# Patient Record
Sex: Female | Born: 1940 | Race: White | Hispanic: No | Smoking: Former smoker
Health system: Southern US, Community
[De-identification: ages and names within clinical notes are randomized; demographics above are authoritative.]

## PROBLEM LIST (undated history)

## (undated) DIAGNOSIS — K219 Gastro-esophageal reflux disease without esophagitis: Secondary | ICD-10-CM

## (undated) DIAGNOSIS — D649 Anemia, unspecified: Secondary | ICD-10-CM

## (undated) DIAGNOSIS — E785 Hyperlipidemia, unspecified: Secondary | ICD-10-CM

## (undated) DIAGNOSIS — I350 Nonrheumatic aortic (valve) stenosis: Secondary | ICD-10-CM

## (undated) DIAGNOSIS — C50919 Malignant neoplasm of unspecified site of unspecified female breast: Secondary | ICD-10-CM

## (undated) DIAGNOSIS — F329 Major depressive disorder, single episode, unspecified: Secondary | ICD-10-CM

## (undated) DIAGNOSIS — I10 Essential (primary) hypertension: Secondary | ICD-10-CM

## (undated) DIAGNOSIS — K5790 Diverticulosis of intestine, part unspecified, without perforation or abscess without bleeding: Secondary | ICD-10-CM

## (undated) DIAGNOSIS — M1990 Unspecified osteoarthritis, unspecified site: Secondary | ICD-10-CM

## (undated) DIAGNOSIS — Z8673 Personal history of transient ischemic attack (TIA), and cerebral infarction without residual deficits: Secondary | ICD-10-CM

## (undated) DIAGNOSIS — F32A Depression, unspecified: Secondary | ICD-10-CM

## (undated) HISTORY — DX: Anemia, unspecified: D64.9

## (undated) HISTORY — DX: Hyperlipidemia, unspecified: E78.5

## (undated) HISTORY — DX: Gastro-esophageal reflux disease without esophagitis: K21.9

## (undated) HISTORY — DX: Personal history of transient ischemic attack (TIA), and cerebral infarction without residual deficits: Z86.73

## (undated) HISTORY — DX: Nonrheumatic aortic (valve) stenosis: I35.0

## (undated) HISTORY — DX: Diverticulosis of intestine, part unspecified, without perforation or abscess without bleeding: K57.90

## (undated) HISTORY — DX: Essential (primary) hypertension: I10

## (undated) HISTORY — PX: CYST EXCISION: SHX5701

## (undated) HISTORY — PX: BREAST BIOPSY: SHX20

## (undated) HISTORY — PX: OTHER SURGICAL HISTORY: SHX169

## (undated) HISTORY — DX: Depression, unspecified: F32.A

## (undated) HISTORY — DX: Unspecified osteoarthritis, unspecified site: M19.90

## (undated) HISTORY — PX: CATARACT EXTRACTION: SUR2

---
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
## 2016-03-21 DIAGNOSIS — C50919 Malignant neoplasm of unspecified site of unspecified female breast: Secondary | ICD-10-CM

## 2016-03-21 HISTORY — DX: Malignant neoplasm of unspecified site of unspecified female breast: C50.919

## 2016-12-16 HISTORY — PX: BREAST BIOPSY: SHX20

## 2016-12-30 HISTORY — PX: BREAST LUMPECTOMY: SHX2

## 2017-10-09 ENCOUNTER — Encounter: Payer: Self-pay | Admitting: Cardiology

## 2018-12-07 ENCOUNTER — Other Ambulatory Visit: Payer: Self-pay

## 2018-12-07 ENCOUNTER — Ambulatory Visit (INDEPENDENT_AMBULATORY_CARE_PROVIDER_SITE_OTHER): Payer: Medicare Other | Admitting: Family Medicine

## 2018-12-07 ENCOUNTER — Encounter: Payer: Self-pay | Admitting: Family Medicine

## 2018-12-07 DIAGNOSIS — E785 Hyperlipidemia, unspecified: Secondary | ICD-10-CM | POA: Diagnosis not present

## 2018-12-07 DIAGNOSIS — G459 Transient cerebral ischemic attack, unspecified: Secondary | ICD-10-CM | POA: Diagnosis not present

## 2018-12-07 DIAGNOSIS — I10 Essential (primary) hypertension: Secondary | ICD-10-CM | POA: Diagnosis not present

## 2018-12-07 DIAGNOSIS — C50912 Malignant neoplasm of unspecified site of left female breast: Secondary | ICD-10-CM

## 2018-12-07 DIAGNOSIS — Z1239 Encounter for other screening for malignant neoplasm of breast: Secondary | ICD-10-CM

## 2018-12-07 DIAGNOSIS — Z170 Estrogen receptor positive status [ER+]: Secondary | ICD-10-CM

## 2018-12-07 DIAGNOSIS — F329 Major depressive disorder, single episode, unspecified: Secondary | ICD-10-CM

## 2018-12-07 DIAGNOSIS — Z7689 Persons encountering health services in other specified circumstances: Secondary | ICD-10-CM

## 2018-12-07 DIAGNOSIS — F17200 Nicotine dependence, unspecified, uncomplicated: Secondary | ICD-10-CM

## 2018-12-07 DIAGNOSIS — E538 Deficiency of other specified B group vitamins: Secondary | ICD-10-CM

## 2018-12-07 DIAGNOSIS — Z1231 Encounter for screening mammogram for malignant neoplasm of breast: Secondary | ICD-10-CM

## 2018-12-07 DIAGNOSIS — Z9889 Other specified postprocedural states: Secondary | ICD-10-CM

## 2018-12-07 DIAGNOSIS — Z5181 Encounter for therapeutic drug level monitoring: Secondary | ICD-10-CM

## 2018-12-07 DIAGNOSIS — K219 Gastro-esophageal reflux disease without esophagitis: Secondary | ICD-10-CM | POA: Diagnosis not present

## 2018-12-07 DIAGNOSIS — I350 Nonrheumatic aortic (valve) stenosis: Secondary | ICD-10-CM

## 2018-12-07 MED ORDER — LETROZOLE 2.5 MG PO TABS: 2.5000 mg | ORAL_TABLET | Freq: Every day | ORAL | 1 refills | Status: DC

## 2018-12-07 MED ORDER — VENLAFAXINE HCL ER 37.5 MG PO CP24: ORAL_CAPSULE | ORAL | 1 refills | Status: DC

## 2018-12-07 NOTE — Progress Notes (Signed)
Name: Heather Carpenter   MRN: [id number]    DOB: 09/15/1940   Date:12/07/2018       Progress Note  Chief Complaint  Patient presents with  . Establish Care    also needs mammo ordered hx breast cancer  . Medication Refill    venafalaxine refill 90 day     Subjective:   Heather Carpenter is a 78 y.o. female, presents to clinic for routine follow up on the conditions listed above.  Moved her from [place] after recent divorce, has a daughter who lives here and now lives in [place].  She needs to establish with several specialist in the area including cardiology and oncology, she will be requesting records for these will have to come in to sign for that.  HTN - patient reports a history of hypertension that is well controlled on valsartan and hydrochlorothiazide she does not have any side effects or concerns of her medication, her blood pressure has always been controlled.  She denies any hypotension, denies any chest pain, chest pressure, orthopnea, palpitations, lower extremity edema, near syncope, headaches or visual disturbances.  HLD-  Patient is on Lipitor 40 mg daily she takes as prescribed, she does not have any myalgias or concerning side effects.  She has been on it for several years.  She will come in for fasting labs  TIA-patient states that she has been on Plavix since she had TIA symptoms but she had no stroke or deficit  Breast CA -  Patient has a history of breast cancer -left breast she is on tamoxifen she will request oncology records and she would like to establish locally with an oncologist.  Depression for past 30 years - on effexor for about the same amount of time.  She would like to decrease or get off this med.  Her ex-husband was the source of a lot of her depression she says and she is very happy here.   She has run out of meds before, she felt dizzy, jittery, with some GI sx as well, but she would like to gradually decrease the dose and see if she can feel  good off the medications.  GERD- She does take daily Prilosec, 20 mg.  She has tried to wean off of it before but she will have worsening acid reflux when she does that.  She denies any melena, hematochezia, abdominal pain bloating or indigestion.  She has had no unintentional weight loss, denies dysphasia She did see GI in the past and has had a esophageal dilatation, she is currently not having any trouble swallowing foods, does not know when she is due for follow-up.  Current every day smoker-she does not wish to stop smoking at this time  PCP in [place], [place], [place], [place]  Cardiology-  [place], [place]   Patient Active Problem List   Diagnosis Date Noted  . Gastroesophageal reflux disease 12/12/2018  . Malignant neoplasm of left breast in female, estrogen receptor positive ([place]) 12/12/2018  . Hyperlipidemia 12/12/2018  . Essential hypertension 12/12/2018  . Current smoker 12/12/2018  . Vitamin B12 deficiency 12/12/2018  . Major depressive disorder with current active episode 12/12/2018  . Aortic valve stenosis 12/12/2018  . History of esophageal dilatation 12/12/2018  . TIA on medication 12/12/2018    Past Surgical History:  Procedure Laterality Date  . BREAST LUMPECTOMY Left 2017   lymph nodes   . CATARACT  EXTRACTION    . collapsed lung       Family History  Problem Relation Age of Onset  . Stroke Mother   . Arthritis Mother   . Heart failure Father   . Prostate cancer Brother   . Depression Daughter   . Heart murmur Son   . Breast cancer Daughter     Social History   Socioeconomic History  . Marital status: Legally Separated    Spouse name: Not on file  . Number of children: 3  . Years of education: 42  . Highest education level: Not on file  Occupational History  . Occupation: retired  Scientific laboratory technician  . Financial resource strain: Not hard at all  . Food  insecurity    Worry: Never true    Inability: Never true  . Transportation needs    Medical: No    Non-medical: No  Tobacco Use  . Smoking status: Current Every Day Smoker    Packs/day: 0.50    Types: Cigarettes  . Smokeless tobacco: Never Used  Substance and Sexual Activity  . Alcohol use: Yes  . Drug use: Never  . Sexual activity: Not Currently  Lifestyle  . Physical activity    Days per week: 7 days    Minutes per session: 30 min  . Stress: Not at all  Relationships  . Social connections    Talks on phone: More than three times a week    Gets together: More than three times a week    Attends religious service: Never    Active member of club or organization: No    Attends meetings of clubs or organizations: Never    Relationship status: Separated  . Intimate partner violence    Fear of current or ex partner: No    Emotionally abused: No    Physically abused: No    Forced sexual activity: No  Other Topics Concern  . Not on file  Social History Narrative  . Not on file     Current Outpatient Medications:  .  atorvastatin (LIPITOR) 40 MG tablet, Take 40 mg by mouth daily., Disp: , Rfl:  .  cholecalciferol (VITAMIN D3) 25 MCG (1000 UT) tablet, Take 2,000 Units by mouth daily., Disp: , Rfl:  .  clopidogrel (PLAVIX) 75 MG tablet, Take 75 mg by mouth daily., Disp: , Rfl:  .  letrozole (FEMARA) 2.5 MG tablet, Take 2.5 mg by mouth daily., Disp: , Rfl:  .  omeprazole (PRILOSEC) 20 MG capsule, Take 20 mg by mouth daily., Disp: , Rfl:  .  valsartan-hydrochlorothiazide (DIOVAN-HCT) 80-12.5 MG tablet, Take 1 tablet by mouth daily., Disp: , Rfl:  .  Venlafaxine HCl 75 MG TB24, Take 75 mg by mouth daily., Disp: , Rfl:  .  vitamin B-12 (CYANOCOBALAMIN) 1000 MCG tablet, Take 1,000 mcg by mouth daily., Disp: , Rfl:   No Known Allergies  I personally reviewed active problem list, medication list, allergies, family history, social history, notes from last encounter, lab results with  the patient/caregiver today.  Review of Systems  Constitutional: Negative.   HENT: Negative.   Eyes: Negative.   Respiratory: Negative.   Cardiovascular: Negative.   Gastrointestinal: Negative.   Endocrine: Negative.   Genitourinary: Negative.   Musculoskeletal: Negative.   Skin: Negative.   Allergic/Immunologic: Negative.   Neurological: Negative.   Hematological: Negative.   Psychiatric/Behavioral: Negative.   All other systems reviewed and are negative.    Objective:    There were no vitals filed  for this visit.  There is no height or weight on file to calculate BMI.  Physical Exam Vitals signs and nursing note reviewed.  Constitutional:      Appearance: Normal appearance. She is well-developed.  HENT:     Head: Normocephalic and atraumatic.     Nose: Nose normal.  Eyes:     General:        Right eye: No discharge.        Left eye: No discharge.     Conjunctiva/sclera: Conjunctivae normal.  Neck:     Trachea: No tracheal deviation.  Cardiovascular:     Rate and Rhythm: Normal rate and regular rhythm.  Pulmonary:     Effort: Pulmonary effort is normal. No respiratory distress.     Breath sounds: No stridor.  Musculoskeletal: Normal range of motion.  Skin:    Findings: No rash.  Neurological:     Mental Status: She is alert.     Motor: No abnormal muscle tone.     Coordination: Coordination normal.  Psychiatric:        Behavior: Behavior normal.       PHQ2/9: Depression screen PHQ 2/9 12/07/2018  Decreased Interest 0  Down, Depressed, Hopeless 0  PHQ - 2 Score 0  Altered sleeping 0  Tired, decreased energy 1  Change in appetite 1  Feeling bad or failure about yourself  0  Trouble concentrating 0  Moving slowly or fidgety/restless 0  Suicidal thoughts 0  PHQ-9 Score 2  Difficult doing work/chores Not difficult at all    phq 9 is negative-reviewed today, change in medication planned  Fall Risk: Fall Risk  12/07/2018  Falls in the past year? 0   Number falls in past yr: 0  Injury with Fall? 0      Functional Status Survey: Is the patient deaf or have difficulty hearing?: No Does the patient have difficulty seeing, even when wearing glasses/contacts?: No Does the patient have difficulty concentrating, remembering, or making decisions?: No Does the patient have difficulty walking or climbing stairs?: No Does the patient have difficulty dressing or bathing?: No Does the patient have difficulty doing errands alone such as visiting a doctor's office or shopping?: No    Assessment & Plan:     ICD-10-CM   1. Essential hypertension  99991111 COMPLETE METABOLIC PANEL WITH GFR  2. Hyperlipidemia, unspecified hyperlipidemia type  99991111 COMPLETE METABOLIC PANEL WITH GFR    Lipid panel   lipitor 40 mg  HLD for more than 15 yrs  3. TIA on medication  G45.9    on plavix  4. Gastroesophageal reflux disease, esophagitis presence not specified  K21.9    on omeprazole - well controlled with daily meds, sx if she holds or weans   5. Malignant neoplasm of left breast in female, estrogen receptor positive, unspecified site of breast ([place])  C50.912 letrozole (FEMARA) 2.5 MG tablet   Z17.0    need local oncologist - on tamoxifen  6. History of esophageal dilatation  Z98.890 Ambulatory referral to Oncology   hx of, no current sx, requesting records, on PPI  7. Aortic valve stenosis, etiology of cardiac valve disease unspecified  I35.0 Ambulatory referral to Cardiology   reports - requests local referral  8. Major depressive disorder with current active episode, unspecified depression episode severity, unspecified whether recurrent  F32.9 venlafaxine XR (EFFEXOR-XR) 37.5 MG 24 hr capsule   is improving with change circumstances would like to wean off medications, discussed a gradual taper  9. Vitamin B12 deficiency  E53.8    Patient reports history of vitamin B deficiency on oral supplement would like to check her labs  10. Screening for  malignant neoplasm of breast  Z12.39 MM 3D SCREEN BREAST BILATERAL  11. Encounter for medication monitoring  Z51.81 CBC with Differential/Platelet    COMPLETE METABOLIC PANEL WITH GFR    Lipid panel  12. Encounter for screening mammogram for malignant neoplasm of breast  Z12.31 MM 3D SCREEN BREAST BILATERAL   Due for mammogram screening, history of left lumpectomy and breast cancer on tamoxifen  13. Encounter to establish care with new doctor  Z76.89    Have populated chart with his much history and information as possible, will request records, patient to come in for labs  14. Current smoker  F17.200    Patient does not wish to quit at this time     Return in about 6 months (around 06/06/2019) for Routine follow-up, Medicare Well Visit (next March) .   Delsa Grana, PA-C 12/07/18 10:58 AM

## 2018-12-12 ENCOUNTER — Encounter: Payer: Self-pay | Admitting: Family Medicine

## 2018-12-12 DIAGNOSIS — C50912 Malignant neoplasm of unspecified site of left female breast: Secondary | ICD-10-CM | POA: Insufficient documentation

## 2018-12-12 DIAGNOSIS — F330 Major depressive disorder, recurrent, mild: Secondary | ICD-10-CM | POA: Insufficient documentation

## 2018-12-12 DIAGNOSIS — F17200 Nicotine dependence, unspecified, uncomplicated: Secondary | ICD-10-CM | POA: Insufficient documentation

## 2018-12-12 DIAGNOSIS — E785 Hyperlipidemia, unspecified: Secondary | ICD-10-CM | POA: Insufficient documentation

## 2018-12-12 DIAGNOSIS — E538 Deficiency of other specified B group vitamins: Secondary | ICD-10-CM | POA: Insufficient documentation

## 2018-12-12 DIAGNOSIS — Z9889 Other specified postprocedural states: Secondary | ICD-10-CM | POA: Insufficient documentation

## 2018-12-12 DIAGNOSIS — I350 Nonrheumatic aortic (valve) stenosis: Secondary | ICD-10-CM | POA: Insufficient documentation

## 2018-12-12 DIAGNOSIS — G459 Transient cerebral ischemic attack, unspecified: Secondary | ICD-10-CM | POA: Insufficient documentation

## 2018-12-12 DIAGNOSIS — F329 Major depressive disorder, single episode, unspecified: Secondary | ICD-10-CM | POA: Insufficient documentation

## 2018-12-12 DIAGNOSIS — K219 Gastro-esophageal reflux disease without esophagitis: Secondary | ICD-10-CM | POA: Insufficient documentation

## 2018-12-12 DIAGNOSIS — I10 Essential (primary) hypertension: Secondary | ICD-10-CM | POA: Insufficient documentation

## 2018-12-15 LAB — COMPLETE METABOLIC PANEL WITH GFR
AG Ratio: 2 (calc) (ref 1.0–2.5)
ALT: 18 U/L (ref 6–29)
AST: 23 U/L (ref 10–35)
Albumin: 4.5 g/dL (ref 3.6–5.1)
Alkaline phosphatase (APISO): 76 U/L (ref 37–153)
BUN: 10 mg/dL (ref 7–25)
CO2: 23 mmol/L (ref 20–32)
Calcium: 10 mg/dL (ref 8.6–10.4)
Chloride: 103 mmol/L (ref 98–110)
Creat: 0.77 mg/dL (ref 0.60–0.93)
GFR, Est African American: 86 mL/min/{1.73_m2} (ref >=60)
GFR, Est Non African American: 74 mL/min/{1.73_m2} (ref >=60)
Globulin: 2.2 g/dL (calc) (ref 1.9–3.7)
Glucose, Bld: 92 mg/dL (ref 65–99)
Potassium: 4.9 mmol/L (ref 3.5–5.3)
Sodium: 139 mmol/L (ref 135–146)
Total Bilirubin: 0.4 mg/dL (ref 0.2–1.2)
Total Protein: 6.7 g/dL (ref 6.1–8.1)

## 2018-12-15 LAB — CBC WITH DIFFERENTIAL/PLATELET
Absolute Monocytes: 496 cells/uL (ref 200–950)
Basophils Absolute: 40 cells/uL (ref 0–200)
Basophils Relative: 0.7 %
Eosinophils Absolute: 97 cells/uL (ref 15–500)
Eosinophils Relative: 1.7 %
HCT: 41.4 % (ref 35.0–45.0)
Hemoglobin: 13.8 g/dL (ref 11.7–15.5)
Lymphs Abs: 2086 cells/uL (ref 850–3900)
MCH: 32.4 pg (ref 27.0–33.0)
MCHC: 33.3 g/dL (ref 32.0–36.0)
MCV: 97.2 fL (ref 80.0–100.0)
MPV: 8.7 fL (ref 7.5–12.5)
Monocytes Relative: 8.7 %
Neutro Abs: 2981 cells/uL (ref 1500–7800)
Neutrophils Relative %: 52.3 %
Platelets: 299 10*3/uL (ref 140–400)
RBC: 4.26 10*6/uL (ref 3.80–5.10)
RDW: 12.7 % (ref 11.0–15.0)
Total Lymphocyte: 36.6 %
WBC: 5.7 10*3/uL (ref 3.8–10.8)

## 2018-12-15 LAB — LIPID PANEL
Cholesterol: 126 mg/dL (ref <200)
HDL: 48 mg/dL — ABNORMAL LOW (ref >=50)
LDL Cholesterol (Calc): 55 mg/dL (calc)
Non-HDL Cholesterol (Calc): 78 mg/dL (calc) (ref <130)
Total CHOL/HDL Ratio: 2.6 (calc) (ref <5.0)
Triglycerides: 146 mg/dL (ref <150)

## 2018-12-17 ENCOUNTER — Encounter: Payer: Self-pay | Admitting: *Deleted

## 2018-12-18 ENCOUNTER — Encounter: Payer: Self-pay | Admitting: Cardiology

## 2018-12-18 ENCOUNTER — Ambulatory Visit (INDEPENDENT_AMBULATORY_CARE_PROVIDER_SITE_OTHER): Payer: Medicare Other | Admitting: Cardiology

## 2018-12-18 ENCOUNTER — Encounter: Payer: Self-pay | Admitting: Family Medicine

## 2018-12-18 ENCOUNTER — Other Ambulatory Visit: Payer: Self-pay

## 2018-12-18 VITALS — BP 122/80 | HR 82 | Temp 97.3°F | Ht 64.5 in | Wt 164.0 lb

## 2018-12-18 DIAGNOSIS — I10 Essential (primary) hypertension: Secondary | ICD-10-CM

## 2018-12-18 DIAGNOSIS — F17200 Nicotine dependence, unspecified, uncomplicated: Secondary | ICD-10-CM

## 2018-12-18 DIAGNOSIS — E7800 Pure hypercholesterolemia, unspecified: Secondary | ICD-10-CM | POA: Diagnosis not present

## 2018-12-18 DIAGNOSIS — I77810 Thoracic aortic ectasia: Secondary | ICD-10-CM | POA: Diagnosis not present

## 2018-12-18 DIAGNOSIS — I350 Nonrheumatic aortic (valve) stenosis: Secondary | ICD-10-CM | POA: Diagnosis not present

## 2018-12-18 NOTE — Patient Instructions (Signed)
Medication Instructions:  Your physician recommends that you continue on your current medications as directed. Please refer to the Current Medication list given to you today.  If you need a refill on your cardiac medications before your next appointment, please call your pharmacy.   Lab work: None ordered If you have labs (blood work) drawn today and your tests are completely normal, you will receive your results only by: Marland Kitchen MyChart Message (if you have MyChart) OR . A paper copy in the mail If you have any lab test that is abnormal or we need to change your treatment, we will call you to review the results.  Testing/Procedures: Your physician has requested that you have an echocardiogram. Echocardiography is a painless test that uses sound waves to create images of your heart. It provides your doctor with information about the size and shape of your heart and how well your heart's chambers and valves are working. This procedure takes approximately one hour. There are no restrictions for this procedure. ( To be scheduled in December 2020)  Follow-Up: At [facility], you and your health needs are our priority.  As part of our continuing mission to provide you with exceptional heart care, we have created designated Provider Care Teams.  These Care Teams include your primary Cardiologist (physician) and Advanced Practice Providers (APPs -  Physician Assistants and Nurse Practitioners) who all work together to provide you with the care you need, when you need it. You will need a follow up appointment 1 week after the echo.     You may see Kate Sable, MD or one of the following Advanced Practice Providers on your designated Care Team:   Murray Hodgkins, NP Christell Faith, PA-C . Marrianne Mood, PA-C  Any Other Special Instructions Will Be Listed Below (If Applicable). N/A

## 2018-12-18 NOTE — Progress Notes (Signed)
Cardiology Office Note:    Date:  12/18/2018   ID:  Heather Carpenter, DOB [birth year], MRN [id number]  PCP:  Delsa Grana, PA-C  Cardiologist:  Kate Sable, MD  Electrophysiologist:  None   Referring MD: Delsa Grana, PA-C   Chief Complaint  Patient presents with  . New Patient (Initial Visit)    Aortic stenosis    History of Present Illness:    Heather Carpenter is a 78 y.o. female with a hx of hypertension, hyperlipidemia, TIA, current smoker x60 years, moderate aortic valve stenosis who presents to establish care.  Patient recently moved from [place] to the area.  She has a known history of aortic valve stenosis.  Last echo performed on 09/11/2018.  Report showed moderate aortic valve stenosis with mean gradient 21 mmHg, EF 70 to 75%.  States doing okay, denies chest pain, palpitations, shortness of breath, orthopnea, presyncope, syncope.  She is recently divorced from her husband for about 19 years.  She states being depressed from the relationship.  He takes Plavix due to history of TIA.  Past Medical History:  Diagnosis Date  . Anemia   . Breast cancer ([place])   . Depression   . GERD (gastroesophageal reflux disease)   . Hx-TIA (transient ischemic attack)   . Hyperlipidemia   . Hypertension   . Moderate aortic stenosis by prior echocardiogram     Past Surgical History:  Procedure Laterality Date  . BREAST LUMPECTOMY Left 2017   lymph nodes   . CATARACT EXTRACTION    . collapsed lung     . CYST EXCISION      Current Medications: Current Meds  Medication Sig  . atorvastatin (LIPITOR) 40 MG tablet Take 40 mg by mouth daily.  . cholecalciferol (VITAMIN D3) 25 MCG (1000 UT) tablet Take 2,000 Units by mouth daily.  . clopidogrel (PLAVIX) 75 MG tablet Take 75 mg by mouth daily.  Marland Kitchen letrozole (FEMARA) 2.5 MG tablet Take 1 tablet (2.5 mg total) by mouth daily.  Marland Kitchen omeprazole (PRILOSEC) 20 MG capsule Take 20 mg by mouth daily.  . valsartan-hydrochlorothiazide (DIOVAN-HCT)  80-12.5 MG tablet Take 1 tablet by mouth daily.  Marland Kitchen venlafaxine XR (EFFEXOR-XR) 37.5 MG 24 hr capsule Take 2 cap (75 mg) PO qam on odd days and take 1 cap (37.5 mg) PO qam on even days for 2 weeks, then take 1 cap po qam daily for 2 w  . vitamin B-12 (CYANOCOBALAMIN) 1000 MCG tablet Take 1,000 mcg by mouth daily.     Allergies:   Patient has no known allergies.   Social History   Socioeconomic History  . Marital status: Legally Separated    Spouse name: Not on file  . Number of children: 3  . Years of education: 59  . Highest education level: Not on file  Occupational History  . Occupation: retired  Scientific laboratory technician  . Financial resource strain: Not hard at all  . Food insecurity    Worry: Never true    Inability: Never true  . Transportation needs    Medical: No    Non-medical: No  Tobacco Use  . Smoking status: Current Every Day Smoker    Packs/day: 0.50    Types: Cigarettes  . Smokeless tobacco: Never Used  Substance and Sexual Activity  . Alcohol use: Yes  . Drug use: Never  . Sexual activity: Not Currently  Lifestyle  . Physical activity    Days per week: 7 days    Minutes per session:  30 min  . Stress: Not at all  Relationships  . Social connections    Talks on phone: More than three times a week    Gets together: More than three times a week    Attends religious service: Never    Active member of club or organization: No    Attends meetings of clubs or organizations: Never    Relationship status: Separated  Other Topics Concern  . Not on file  Social History Narrative  . Not on file     Family History: The patient's family history includes Arthritis in her mother; Breast cancer in her daughter; Depression in her daughter; Heart failure in her father; Heart murmur in her son; Prostate cancer in her brother; Stroke in her mother.  ROS:   Please see the history of present illness.     All other systems reviewed and are negative.  EKGs/Labs/Other Studies  Reviewed:    The following studies were reviewed today:  The prior echo report dated 09/11/2018, was reviewed by myself.  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  EKG:  EKG is  ordered today.  The ekg ordered today demonstrates normal sinus rhythm, normal ECG.  Recent Labs: 12/14/2018: ALT 18; BUN 10; Creat 0.77; Hemoglobin 13.8; Platelets 299; Potassium 4.9; Sodium 139  Recent Lipid Panel    Component Value Date/Time   CHOL 126 12/14/2018 0000   TRIG 146 12/14/2018 0000   HDL 48 (L) 12/14/2018 0000   CHOLHDL 2.6 12/14/2018 0000   LDLCALC 55 12/14/2018 0000    Physical Exam:    VS:  BP 122/80 (BP Location: Right Arm, Patient Position: Sitting, Cuff Size: Normal)   Pulse 82   Temp (!) 97.3 F (36.3 C)   Ht 5' 4.5" (1.638 m)   Wt 164 lb (74.4 kg)   SpO2 98%   BMI 27.72 kg/m     Wt Readings from Last 3 Encounters:  12/18/18 164 lb (74.4 kg)     GEN:  Well nourished, well developed in no acute distress HEENT: Normal NECK: No JVD; No carotid bruits LYMPHATICS: No lymphadenopathy CARDIAC: RRR, 3/6 systolic murmur at the right upper sternal border.  No rubs, gallops RESPIRATORY:  Clear to auscultation without rales, wheezing or rhonchi  ABDOMEN: Soft, non-tender, non-distended MUSCULOSKELETAL:  No edema; No deformity  SKIN: Warm and dry NEUROLOGIC:  Alert and oriented x 3 PSYCHIATRIC:  Normal affect   ASSESSMENT:    1. Aortic valve stenosis, etiology of cardiac valve disease unspecified   2. 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 Essential hypertension   4. Pure hypercholesterolemia   5. Smoking    PLAN:    In order of problems listed above:  1. We will repeat echocardiogram in 6 months from last.  Schedule echo for December. 2. We will review via echo as above. 3. Blood pressure well controlled continue HCTZ and valsartan as prescribed. 4. Continue Lipitor 40 as currently  prescribed 5. Smoking cessation advised.  Patient states she has Chantix packs at home.  Follow-up after echo.  Total encounter time more than 45 minutes  Greater than 50% was spent in counseling and coordination of care with the patient  Medication Adjustments/Labs and Tests Ordered: Current medicines are reviewed at length with the patient today.  Concerns regarding medicines are outlined above.  Orders Placed This Encounter  Procedures  . EKG 12-Lead  . ECHOCARDIOGRAM COMPLETE  No orders of the defined types were placed in this encounter.   Patient Instructions  Medication Instructions:  Your physician recommends that you continue on your current medications as directed. Please refer to the Current Medication list given to you today.  If you need a refill on your cardiac medications before your next appointment, please call your pharmacy.   Lab work: None ordered If you have labs (blood work) drawn today and your tests are completely normal, you will receive your results only by: Marland Kitchen MyChart Message (if you have MyChart) OR . A paper copy in the mail If you have any lab test that is abnormal or we need to change your treatment, we will call you to review the results.  Testing/Procedures: Your physician has requested that you have an echocardiogram. Echocardiography is a painless test that uses sound waves to create images of your heart. It provides your doctor with information about the size and shape of your heart and how well your heart's chambers and valves are working. This procedure takes approximately one hour. There are no restrictions for this procedure. ( To be scheduled in December 2020)  Follow-Up: At [facility], you and your health needs are our priority.  As part of our continuing mission to provide you with exceptional heart care, we have created designated Provider Care Teams.  These Care Teams include your primary Cardiologist (physician) and Advanced  Practice Providers (APPs -  Physician Assistants and Nurse Practitioners) who all work together to provide you with the care you need, when you need it. You will need a follow up appointment 1 week after the echo.     You may see Kate Sable, MD or one of the following Advanced Practice Providers on your designated Care Team:   Murray Hodgkins, NP Christell Faith, PA-C . Marrianne Mood, PA-C  Any Other Special Instructions Will Be Listed Below (If Applicable). N/A      Signed, Kate Sable, MD  12/18/2018 11:01 AM    [place]

## 2018-12-20 ENCOUNTER — Other Ambulatory Visit: Payer: Self-pay | Admitting: Family Medicine

## 2018-12-20 DIAGNOSIS — Z853 Personal history of malignant neoplasm of breast: Secondary | ICD-10-CM

## 2019-01-07 ENCOUNTER — Ambulatory Visit
Admission: RE | Admit: 2019-01-07 | Discharge: 2019-01-07 | Disposition: A | Discharge status: Home / Self Care | Payer: Medicare Other | Source: Ambulatory Visit | Attending: Family Medicine | Admitting: Family Medicine

## 2019-01-07 DIAGNOSIS — Z853 Personal history of malignant neoplasm of breast: Secondary | ICD-10-CM | POA: Insufficient documentation

## 2019-02-18 ENCOUNTER — Encounter: Payer: Self-pay | Admitting: Family Medicine

## 2019-02-21 ENCOUNTER — Other Ambulatory Visit: Payer: Self-pay

## 2019-02-21 ENCOUNTER — Ambulatory Visit (INDEPENDENT_AMBULATORY_CARE_PROVIDER_SITE_OTHER): Payer: Medicare Other

## 2019-02-21 DIAGNOSIS — I350 Nonrheumatic aortic (valve) stenosis: Secondary | ICD-10-CM

## 2019-02-21 MED ORDER — PERFLUTREN LIPID MICROSPHERE
1.0000 mL | INTRAVENOUS | Status: AC | PRN
  Administered 2019-02-21: 2 mL via INTRAVENOUS

## 2019-02-22 ENCOUNTER — Telehealth: Payer: Self-pay

## 2019-02-22 NOTE — Telephone Encounter (Signed)
Called to give the patient echo results. [email or web address] 

## 2019-02-22 NOTE — Telephone Encounter (Signed)
-----   Message from Kate Sable, MD sent at 02/22/2019 12:40 PM EST ----- Moderate aortic stenosis, normal ejection fraction.  Please keep follow-up appointments.

## 2019-02-28 ENCOUNTER — Ambulatory Visit (INDEPENDENT_AMBULATORY_CARE_PROVIDER_SITE_OTHER): Payer: Medicare Other | Admitting: Cardiology

## 2019-02-28 ENCOUNTER — Encounter: Payer: Self-pay | Admitting: Cardiology

## 2019-02-28 ENCOUNTER — Other Ambulatory Visit: Payer: Self-pay

## 2019-02-28 VITALS — BP 120/80 | HR 80 | Temp 96.7°F | Ht 64.5 in | Wt 165.0 lb

## 2019-02-28 DIAGNOSIS — E7800 Pure hypercholesterolemia, unspecified: Secondary | ICD-10-CM | POA: Diagnosis not present

## 2019-02-28 DIAGNOSIS — I350 Nonrheumatic aortic (valve) stenosis: Secondary | ICD-10-CM | POA: Diagnosis not present

## 2019-02-28 DIAGNOSIS — I10 Essential (primary) hypertension: Secondary | ICD-10-CM | POA: Diagnosis not present

## 2019-02-28 DIAGNOSIS — F17200 Nicotine dependence, unspecified, uncomplicated: Secondary | ICD-10-CM

## 2019-02-28 NOTE — Progress Notes (Signed)
Cardiology Office Note:    Date:  02/28/2019   ID:  Heather Carpenter, DOB [birth year], MRN [id number]  PCP:  Delsa Grana, PA-C  Cardiologist:  Kate Sable, MD  Electrophysiologist:  None   Referring MD: Delsa Grana, PA-C   Chief Complaint  Patient presents with  . office visit    F/U after echo; Meds verbally reviewed with patient.    History of Present Illness:    Heather Carpenter is a 78 y.o. female with a hx of hypertension, hyperlipidemia, TIA, current smoker x60 years, moderate aortic valve stenosis who presents for follow-up.  She was originally seen to establish care.    Patient recently moved from [place] to the area.  She has a known history of aortic valve stenosis.  Last echo performed on 09/11/2018.  Report showed moderate aortic valve stenosis with mean gradient 21 mmHg, EF 70 to 75%.  States doing okay, denies chest pain, palpitations, shortness of breath, orthopnea, presyncope, syncope.  She takes Plavix due to history of TIA.  Repeat echocardiogram was ordered  Past Medical History:  Diagnosis Date  . Anemia   . Breast cancer ([place]) 2018   left breast lumpectomy  . Depression   . GERD (gastroesophageal reflux disease)   . Hx-TIA (transient ischemic attack)   . Hyperlipidemia   . Hypertension   . Moderate aortic stenosis by prior echocardiogram     Past Surgical History:  Procedure Laterality Date  . BREAST BIOPSY Left 12/16/2016   [place] bx of mass at 12:00  . BREAST BIOPSY Left ? DATE   BENIGN, MARKER IN BREAST  . BREAST BIOPSY Right YRS AGO   BENIGN  . BREAST LUMPECTOMY Left 12/30/2016   left breast ca at 12:00 and LN excisied  . CATARACT EXTRACTION    . collapsed lung     . CYST EXCISION      Current Medications: Current Meds  Medication Sig  . atorvastatin (LIPITOR) 40 MG tablet Take 40 mg by mouth daily.  . Calcium Carbonate-Vitamin D 600-200 MG-UNIT TABS Take 1 tablet by mouth daily.  . cholecalciferol (VITAMIN D3) 25 MCG (1000 UT) tablet  Take 2,000 Units by mouth daily.  . clopidogrel (PLAVIX) 75 MG tablet Take 75 mg by mouth daily.  Marland Kitchen letrozole (FEMARA) 2.5 MG tablet Take 1 tablet (2.5 mg total) by mouth daily.  Marland Kitchen omeprazole (PRILOSEC) 20 MG capsule Take 20 mg by mouth daily.  . valsartan-hydrochlorothiazide (DIOVAN-HCT) 80-12.5 MG tablet Take 1 tablet by mouth daily.  Marland Kitchen venlafaxine XR (EFFEXOR-XR) 37.5 MG 24 hr capsule Take 2 cap (75 mg) PO qam on odd days and take 1 cap (37.5 mg) PO qam on even days for 2 weeks, then take 1 cap po qam daily for 2 w (Patient taking differently: every other day. Take 2 cap (75 mg) PO qam on odd days and take 1 cap (37.5 mg) PO qam on even days for 2 weeks, then take 1 cap po qam daily for 2 w)  . vitamin B-12 (CYANOCOBALAMIN) 1000 MCG tablet Take 1,000 mcg by mouth daily.     Allergies:   Patient has no known allergies.   Social History   Socioeconomic History  . Marital status: Legally Separated    Spouse name: Not on file  . Number of children: 3  . Years of education: 25  . Highest education level: Not on file  Occupational History  . Occupation: retired  Tobacco Use  . Smoking status: Current Every Day Smoker  Packs/day: 0.50    Types: Cigarettes  . Smokeless tobacco: Never Used  Substance and Sexual Activity  . Alcohol use: Yes  . Drug use: Never  . Sexual activity: Not Currently  Other Topics Concern  . Not on file  Social History Narrative  . Not on file   Social Determinants of Health   Financial Resource Strain: Low Risk   . Difficulty of Paying Living Expenses: Not hard at all  Food Insecurity: No Food Insecurity  . Worried About Charity fundraiser in the Last Year: Never true  . Ran Out of Food in the Last Year: Never true  Transportation Needs: No Transportation Needs  . Lack of Transportation (Medical): No  . Lack of Transportation (Non-Medical): No  Physical Activity: Sufficiently Active  . Days of Exercise per Week: 7 days  . Minutes of Exercise per  Session: 30 min  Stress: No Stress Concern Present  . Feeling of Stress : Not at all  Social Connections: Moderately Isolated  . Frequency of Communication with Friends and Family: More than three times a week  . Frequency of Social Gatherings with Friends and Family: More than three times a week  . Attends Religious Services: Never  . Active Member of Clubs or Organizations: No  . Attends Archivist Meetings: Never  . Marital Status: Separated     Family History: The patient's family history includes Arthritis in her mother; Breast cancer (age of onset: 21) in her daughter; Depression in her daughter; Heart failure in her father; Heart murmur in her son; Prostate cancer in her brother; Stroke in her mother.  ROS:   Please see the history of present illness.     All other systems reviewed and are negative.  EKGs/Labs/Other Studies Reviewed:    The following studies were reviewed today:  TTE 02/21/2019 1. Left ventricular ejection fraction, by visual estimation, is 55 to 60%. The left ventricle has normal function. There is no left ventricular hypertrophy.  2. Left ventricular diastolic parameters are consistent with Grade I diastolic dysfunction (impaired relaxation).  3. Global right ventricle has normal systolic function.The right ventricular size is normal. No increase in right ventricular wall thickness.  4. Left atrial size was normal.  5. The aortic valve was not well visualized. Aortic valve regurgitation is mild. Moderate aortic valve stenosis by mean gradient adn peak velocity, with severe stenosis by estimated AVA.   The prior echo report dated 09/11/2018, was reviewed by myself.  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  EKG:  EKG is  ordered today.  The ekg ordered today demonstrates normal sinus rhythm, normal ECG.  Recent Labs: 12/14/2018: ALT 18; BUN 10; Creat 0.77;  Hemoglobin 13.8; Platelets 299; Potassium 4.9; Sodium 139  Recent Lipid Panel    Component Value Date/Time   CHOL 126 12/14/2018 0000   TRIG 146 12/14/2018 0000   HDL 48 (L) 12/14/2018 0000   CHOLHDL 2.6 12/14/2018 0000   LDLCALC 55 12/14/2018 0000    Physical Exam:    VS:  BP 120/80 (BP Location: Left Arm, Patient Position: Sitting, Cuff Size: Normal)   Pulse 80   Temp (!) 96.7 F (35.9 C)   Ht 5' 4.5" (1.638 m)   Wt 165 lb (74.8 kg)   SpO2 98%   BMI 27.88 kg/m     Wt Readings from Last 3 Encounters:  02/28/19 165 lb (74.8 kg)  12/18/18 164 lb (74.4 kg)     GEN:  Well nourished, well developed in no acute distress HEENT: Normal NECK: No JVD; No carotid bruits LYMPHATICS: No lymphadenopathy CARDIAC: RRR, 3/6 systolic murmur at the right upper sternal border.  No rubs, gallops RESPIRATORY:  Clear to auscultation without rales, wheezing or rhonchi  ABDOMEN: Soft, non-tender, non-distended MUSCULOSKELETAL:  No edema; No deformity  SKIN: Warm and dry NEUROLOGIC:  Alert and oriented x 3 PSYCHIATRIC:  Normal affect   ASSESSMENT:   Heather Carpenter with history of moderate aortic valve stenosis.  Patient is without any symptoms.  Repeat echocardiogram shows moderate aortic valve stenosis 1. Moderate aortic stenosis   2. Essential hypertension   3. Pure hypercholesterolemia   4. Smoking    PLAN:   b  In order of problems listed above:  1. Moderate aortic valve stenosis.  Repeat echocardiogram in 1 year. 2. Blood pressure well controlled continue HCTZ and valsartan as prescribed. 3. Continue Lipitor 40 as currently prescribed 4. Smoking cessation advised.  Patient states she has Chantix packs at home.  Follow-up after echo in 1 year.    Medication Adjustments/Labs and Tests Ordered: Current medicines are reviewed at length with the patient today.  Concerns regarding medicines are outlined above.  Orders Placed This Encounter  Procedures  . EKG 12-Lead  . ECHOCARDIOGRAM  COMPLETE   No orders of the defined types were placed in this encounter.   Patient Instructions  Medication Instructions:  Your physician recommends that you continue on your current medications as directed. Please refer to the Current Medication list given to you today.  *If you need a refill on your cardiac medications before your next appointment, please call your pharmacy*  Lab Work: none If you have labs (blood work) drawn today and your tests are completely normal, you will receive your results only by: Marland Kitchen MyChart Message (if you have MyChart) OR . A paper copy in the mail If you have any lab test that is abnormal or we need to change your treatment, we will call you to review the results.  Testing/Procedures: 1- Echo in one year prior to follow up appointment - Your physician has requested that you have an echocardiogram. Echocardiography is a painless test that uses sound waves to create images of your heart. It provides your doctor with information about the size and shape of your heart and how well your heart's chambers and valves are working. This procedure takes approximately one hour. There are no restrictions for this procedure. You may get an IV, if needed, to receive an ultrasound enhancing agent through to better visualize your heart.   Follow-Up: At [facility], you and your health needs are our priority.  As part of our continuing mission to provide you with exceptional heart care, we have created designated Provider Care Teams.  These Care Teams include your primary Cardiologist (physician) and Advanced Practice Providers (APPs -  Physician Assistants and Nurse Practitioners) who all work together to provide you with the care you need, when you need it.  Your next appointment:   12 month(s)  The format for your next appointment:   In Person  Provider:    You may see Kate Sable, MD or one of the following Advanced Practice Providers on your designated Care  Team:    Murray Hodgkins, NP  Christell Faith, PA-C  Marrianne Mood, PA-C   Echocardiogram An echocardiogram is a procedure that uses painless sound waves (ultrasound) to produce an image of  the heart. Images from an echocardiogram can provide important information about:  Signs of coronary artery disease (CAD).  Aneurysm detection. An aneurysm is a weak or damaged part of an artery wall that bulges out from the normal force of blood pumping through the body.  Heart size and shape. Changes in the size or shape of the heart can be associated with certain conditions, including heart failure, aneurysm, and CAD.  Heart muscle function.  Heart valve function.  Signs of a past heart attack.  Fluid buildup around the heart.  Thickening of the heart muscle.  A tumor or infectious growth around the heart valves. Tell a health care provider about:  Any allergies you have.  All medicines you are taking, including vitamins, herbs, eye drops, creams, and over-the-counter medicines.  Any blood disorders you have.  Any surgeries you have had.  Any medical conditions you have.  Whether you are pregnant or may be pregnant. What are the risks? Generally, this is a safe procedure. However, problems may occur, including:  Allergic reaction to dye (contrast) that may be used during the procedure. What happens before the procedure? No specific preparation is needed. You may eat and drink normally. What happens during the procedure?   An IV tube may be inserted into one of your veins.  You may receive contrast through this tube. A contrast is an injection that improves the quality of the pictures from your heart.  A gel will be applied to your chest.  A wand-like tool (transducer) will be moved over your chest. The gel will help to transmit the sound waves from the transducer.  The sound waves will harmlessly bounce off of your heart to allow the heart images to be captured in  real-time motion. The images will be recorded on a computer. The procedure may vary among health care providers and hospitals. What happens after the procedure?  You may return to your normal, everyday life, including diet, activities, and medicines, unless your health care provider tells you not to do that. Summary  An echocardiogram is a procedure that uses painless sound waves (ultrasound) to produce an image of the heart.  Images from an echocardiogram can provide important information about the size and shape of your heart, heart muscle function, heart valve function, and fluid buildup around your heart.  You do not need to do anything to prepare before this procedure. You may eat and drink normally.  After the echocardiogram is completed, you may return to your normal, everyday life, unless your health care provider tells you not to do that. This information is not intended to replace advice given to you by your health care provider. Make sure you discuss any questions you have with your health care provider. Document Released: 03/04/2000 Document Revised: 06/28/2018 Document Reviewed: 04/09/2016 Elsevier Patient Education  2020 [place], Kate Sable, MD  02/28/2019 10:53 AM    [place]

## 2019-02-28 NOTE — Patient Instructions (Signed)
Medication Instructions:  Your physician recommends that you continue on your current medications as directed. Please refer to the Current Medication list given to you today.  *If you need a refill on your cardiac medications before your next appointment, please call your pharmacy*  Lab Work: none If you have labs (blood work) drawn today and your tests are completely normal, you will receive your results only by: Marland Kitchen MyChart Message (if you have MyChart) OR . A paper copy in the mail If you have any lab test that is abnormal or we need to change your treatment, we will call you to review the results.  Testing/Procedures: 1- Echo in one year prior to follow up appointment - Your physician has requested that you have an echocardiogram. Echocardiography is a painless test that uses sound waves to create images of your heart. It provides your doctor with information about the size and shape of your heart and how well your heart's chambers and valves are working. This procedure takes approximately one hour. There are no restrictions for this procedure. You may get an IV, if needed, to receive an ultrasound enhancing agent through to better visualize your heart.   Follow-Up: At [facility], you and your health needs are our priority.  As part of our continuing mission to provide you with exceptional heart care, we have created designated Provider Care Teams.  These Care Teams include your primary Cardiologist (physician) and Advanced Practice Providers (APPs -  Physician Assistants and Nurse Practitioners) who all work together to provide you with the care you need, when you need it.  Your next appointment:   12 month(s)  The format for your next appointment:   In Person  Provider:    You may see Kate Sable, MD or one of the following Advanced Practice Providers on your designated Care Team:    Murray Hodgkins, NP  Christell Faith, PA-C  Marrianne Mood, PA-C   Echocardiogram An  echocardiogram is a procedure that uses painless sound waves (ultrasound) to produce an image of the heart. Images from an echocardiogram can provide important information about:  Signs of coronary artery disease (CAD).  Aneurysm detection. An aneurysm is a weak or damaged part of an artery wall that bulges out from the normal force of blood pumping through the body.  Heart size and shape. Changes in the size or shape of the heart can be associated with certain conditions, including heart failure, aneurysm, and CAD.  Heart muscle function.  Heart valve function.  Signs of a past heart attack.  Fluid buildup around the heart.  Thickening of the heart muscle.  A tumor or infectious growth around the heart valves. Tell a health care provider about:  Any allergies you have.  All medicines you are taking, including vitamins, herbs, eye drops, creams, and over-the-counter medicines.  Any blood disorders you have.  Any surgeries you have had.  Any medical conditions you have.  Whether you are pregnant or may be pregnant. What are the risks? Generally, this is a safe procedure. However, problems may occur, including:  Allergic reaction to dye (contrast) that may be used during the procedure. What happens before the procedure? No specific preparation is needed. You may eat and drink normally. What happens during the procedure?   An IV tube may be inserted into one of your veins.  You may receive contrast through this tube. A contrast is an injection that improves the quality of the pictures from your heart.  A gel  will be applied to your chest.  A wand-like tool (transducer) will be moved over your chest. The gel will help to transmit the sound waves from the transducer.  The sound waves will harmlessly bounce off of your heart to allow the heart images to be captured in real-time motion. The images will be recorded on a computer. The procedure may vary among health care  providers and hospitals. What happens after the procedure?  You may return to your normal, everyday life, including diet, activities, and medicines, unless your health care provider tells you not to do that. Summary  An echocardiogram is a procedure that uses painless sound waves (ultrasound) to produce an image of the heart.  Images from an echocardiogram can provide important information about the size and shape of your heart, heart muscle function, heart valve function, and fluid buildup around your heart.  You do not need to do anything to prepare before this procedure. You may eat and drink normally.  After the echocardiogram is completed, you may return to your normal, everyday life, unless your health care provider tells you not to do that. This information is not intended to replace advice given to you by your health care provider. Make sure you discuss any questions you have with your health care provider. Document Released: 03/04/2000 Document Revised: 06/28/2018 Document Reviewed: 04/09/2016 Elsevier Patient Education  2020 [employer].

## 2019-03-09 ENCOUNTER — Other Ambulatory Visit: Payer: Self-pay | Admitting: Family Medicine

## 2019-03-09 DIAGNOSIS — F329 Major depressive disorder, single episode, unspecified: Secondary | ICD-10-CM

## 2019-03-11 NOTE — Telephone Encounter (Signed)
Please call patient - I am covering for Leisa in her absence - is she still taking effexor? If so, did she taper down as discussed with Leisa at their last visit? If still taking, how much is she taking?

## 2019-03-11 NOTE — Telephone Encounter (Signed)
Patient stated she did not need a refill. She has been weaning off of the medication

## 2019-03-19 NOTE — Telephone Encounter (Signed)
Patient seen on 02/28/19 by Dr. Garen Lah. Echo results were reviwed at the pt appt.

## 2019-05-26 ENCOUNTER — Emergency Department: Payer: Medicare Other

## 2019-05-26 ENCOUNTER — Other Ambulatory Visit: Payer: Self-pay

## 2019-05-26 ENCOUNTER — Encounter: Payer: Self-pay | Admitting: Emergency Medicine

## 2019-05-26 ENCOUNTER — Emergency Department
Admission: EM | Admit: 2019-05-26 | Discharge: 2019-05-26 | Disposition: A | Discharge status: Home / Self Care | Payer: Medicare Other | Attending: Emergency Medicine | Admitting: Emergency Medicine

## 2019-05-26 ENCOUNTER — Other Ambulatory Visit: Payer: Self-pay | Admitting: Family Medicine

## 2019-05-26 DIAGNOSIS — K6289 Other specified diseases of anus and rectum: Secondary | ICD-10-CM | POA: Insufficient documentation

## 2019-05-26 DIAGNOSIS — I10 Essential (primary) hypertension: Secondary | ICD-10-CM | POA: Insufficient documentation

## 2019-05-26 DIAGNOSIS — Z7902 Long term (current) use of antithrombotics/antiplatelets: Secondary | ICD-10-CM | POA: Diagnosis not present

## 2019-05-26 DIAGNOSIS — Z79899 Other long term (current) drug therapy: Secondary | ICD-10-CM | POA: Insufficient documentation

## 2019-05-26 DIAGNOSIS — R1030 Lower abdominal pain, unspecified: Secondary | ICD-10-CM | POA: Insufficient documentation

## 2019-05-26 DIAGNOSIS — R109 Unspecified abdominal pain: Secondary | ICD-10-CM | POA: Diagnosis present

## 2019-05-26 DIAGNOSIS — F17210 Nicotine dependence, cigarettes, uncomplicated: Secondary | ICD-10-CM | POA: Insufficient documentation

## 2019-05-26 DIAGNOSIS — C50912 Malignant neoplasm of unspecified site of left female breast: Secondary | ICD-10-CM

## 2019-05-26 LAB — LIPASE, BLOOD: Lipase: 27 U/L (ref 11–51)

## 2019-05-26 LAB — URINALYSIS, COMPLETE (UACMP) WITH MICROSCOPIC
Bacteria, UA: NONE SEEN
Bilirubin Urine: NEGATIVE
Glucose, UA: NEGATIVE mg/dL
Hgb urine dipstick: NEGATIVE
Ketones, ur: NEGATIVE mg/dL
Leukocytes,Ua: NEGATIVE
Nitrite: NEGATIVE
Protein, ur: NEGATIVE mg/dL
Specific Gravity, Urine: 1.021 (ref 1.005–1.030)
Squamous Epithelial / HPF: NONE SEEN (ref 0–5)
pH: 7 (ref 5.0–8.0)

## 2019-05-26 LAB — COMPREHENSIVE METABOLIC PANEL
ALT: 19 U/L (ref 0–44)
AST: 20 U/L (ref 15–41)
Albumin: 4 g/dL (ref 3.5–5.0)
Alkaline Phosphatase: 66 U/L (ref 38–126)
Anion gap: 13 (ref 5–15)
BUN: 15 mg/dL (ref 8–23)
CO2: 24 mmol/L (ref 22–32)
Calcium: 9.3 mg/dL (ref 8.9–10.3)
Chloride: 101 mmol/L (ref 98–111)
Creatinine, Ser: 0.72 mg/dL (ref 0.44–1.00)
GFR calc Af Amer: >60 mL/min (ref >60)
GFR calc non Af Amer: >60 mL/min (ref >60)
Glucose, Bld: 98 mg/dL (ref 70–99)
Potassium: 3.6 mmol/L (ref 3.5–5.1)
Sodium: 138 mmol/L (ref 135–145)
Total Bilirubin: 0.9 mg/dL (ref 0.3–1.2)
Total Protein: 6.6 g/dL (ref 6.5–8.1)

## 2019-05-26 LAB — CBC
HCT: 39.3 % (ref 36.0–46.0)
Hemoglobin: 13.2 g/dL (ref 12.0–15.0)
MCH: 32.2 pg (ref 26.0–34.0)
MCHC: 33.6 g/dL (ref 30.0–36.0)
MCV: 95.9 fL (ref 80.0–100.0)
Platelets: 259 10*3/uL (ref 150–400)
RBC: 4.1 MIL/uL (ref 3.87–5.11)
RDW: 12 % (ref 11.5–15.5)
WBC: 9.2 10*3/uL (ref 4.0–10.5)
nRBC: 0 % (ref 0.0–0.2)

## 2019-05-26 MED ORDER — KETOROLAC TROMETHAMINE 30 MG/ML IJ SOLN
15.0000 mg | Freq: Once | INTRAMUSCULAR | Status: AC
  Administered 2019-05-26: 15 mg via INTRAVENOUS
  Filled 2019-05-26: qty 1

## 2019-05-26 MED ORDER — ONDANSETRON HCL 4 MG/2ML IJ SOLN
4.0000 mg | Freq: Once | INTRAMUSCULAR | Status: AC
  Administered 2019-05-26: 4 mg via INTRAVENOUS
  Filled 2019-05-26: qty 2

## 2019-05-26 MED ORDER — IOHEXOL 300 MG/ML  SOLN
100.0000 mL | Freq: Once | INTRAMUSCULAR | Status: AC | PRN
  Administered 2019-05-26: 100 mL via INTRAVENOUS

## 2019-05-26 MED ORDER — ACETAMINOPHEN 500 MG PO TABS
1000.0000 mg | ORAL_TABLET | Freq: Once | ORAL | Status: AC
  Administered 2019-05-26: 1000 mg via ORAL
  Filled 2019-05-26: qty 2

## 2019-05-26 MED ORDER — FENTANYL CITRATE (PF) 100 MCG/2ML IJ SOLN
50.0000 ug | Freq: Once | INTRAMUSCULAR | Status: AC
  Administered 2019-05-26: 50 ug via INTRAVENOUS
  Filled 2019-05-26: qty 2

## 2019-05-26 NOTE — ED Notes (Signed)
Pt presents to the ED for lower abdominal pain, rectal pain, and constipation that has been going on a couple of weeks. On assessment, pt abd soft and non-tender. Assisted Dr. Jari Pigg, MD with rectal exam. No evidence of hemorrhoids on stool burden. Pt V/S WNL. Pt A&Ox4 and NAD at this time.

## 2019-05-26 NOTE — ED Notes (Signed)
Pt ambulated to the restroom without assistance

## 2019-05-26 NOTE — ED Provider Notes (Signed)
[facility] Emergency Department Provider Note  ____________________________________________   First MD Initiated Contact with Patient 05/26/19 [phone]     (approximate)  I have reviewed the triage vital signs and the nursing notes.   HISTORY  Chief Complaint Abdominal Pain and Rectal Pain    HPI Heather Carpenter is a 79 y.o. female with hypertension, hyperlipidemia comes in for abdominal pain and back pain as well as perirectal pain.  Patient states for the past 2 to 3 weeks she has been having lower abdominal pain that is moderate, intermittent, worse in the morning worse with trying to sit up.  Denies having this previously.  Has a history of spinal stenosis and is getting injections and states that she thinks the pain started after 1 of these injections but does not think it is related.  She states that she is not been eating as well as she should be.  She states that she feels a lot of pressure sensation in her rectum region.  She is been taking mag citrate without resolution of this sensation.  States that she does not have any hemorrhoids that she knows of.  Patient had a history of lumpectomy but never needed chemotherapy or radiation.  She states that she has had a 10 pound weight loss over the past few months.            Past Medical History:  Diagnosis Date  . Anemia   . Breast cancer ([place]) 2018   left breast lumpectomy  . Depression   . GERD (gastroesophageal reflux disease)   . Hx-TIA (transient ischemic attack)   . Hyperlipidemia   . Hypertension   . Moderate aortic stenosis by prior echocardiogram     Patient Active Problem List   Diagnosis Date Noted  . Gastroesophageal reflux disease 12/12/2018  . Malignant neoplasm of left breast in female, estrogen receptor positive ([place]) 12/12/2018  . Hyperlipidemia 12/12/2018  . Essential hypertension 12/12/2018  . Current smoker 12/12/2018  . Vitamin B12 deficiency 12/12/2018  . Major  depressive disorder with current active episode 12/12/2018  . Aortic valve stenosis 12/12/2018  . History of esophageal dilatation 12/12/2018  . TIA on medication 12/12/2018    Past Surgical History:  Procedure Laterality Date  . BREAST BIOPSY Left 12/16/2016   [place] bx of mass at 12:00  . BREAST BIOPSY Left ? DATE   BENIGN, MARKER IN BREAST  . BREAST BIOPSY Right YRS AGO   BENIGN  . BREAST LUMPECTOMY Left 12/30/2016   left breast ca at 12:00 and LN excisied  . CATARACT EXTRACTION    . collapsed lung     . CYST EXCISION      Prior to Admission medications   Medication Sig Start Date End Date Taking? Authorizing Provider  atorvastatin (LIPITOR) 40 MG tablet Take 40 mg by mouth daily.    [provider]  Calcium Carbonate-Vitamin D 600-200 MG-UNIT TABS Take 1 tablet by mouth daily.    [provider]  cholecalciferol (VITAMIN D3) 25 MCG (1000 UT) tablet Take 2,000 Units by mouth daily.    [provider]  clopidogrel (PLAVIX) 75 MG tablet Take 75 mg by mouth daily.    [provider]  letrozole (FEMARA) 2.5 MG tablet Take 1 tablet (2.5 mg total) by mouth daily. 12/07/18   Delsa Grana, PA-C  omeprazole (PRILOSEC) 20 MG capsule Take 20 mg by mouth daily.    [provider]  valsartan-hydrochlorothiazide (DIOVAN-HCT) 80-12.5 MG tablet Take  1 tablet by mouth daily.    [provider]  venlafaxine XR (EFFEXOR-XR) 37.5 MG 24 hr capsule Take 2 cap (75 mg) PO qam on odd days and take 1 cap (37.5 mg) PO qam on even days for 2 weeks, then take 1 cap po qam daily for 2 w Patient taking differently: every other day. Take 2 cap (75 mg) PO qam on odd days and take 1 cap (37.5 mg) PO qam on even days for 2 weeks, then take 1 cap po qam daily for 2 w 12/07/18   Delsa Grana, PA-C  vitamin B-12 (CYANOCOBALAMIN) 1000 MCG tablet Take 1,000 mcg by mouth daily.    [provider]    Allergies Patient has no known allergies.  Family History    Problem Relation Age of Onset  . Stroke Mother   . Arthritis Mother   . Heart failure Father   . Prostate cancer Brother   . Depression Daughter   . Heart murmur Son   . Breast cancer Daughter 47    Social History Social History   Tobacco Use  . Smoking status: Current Every Day Smoker    Packs/day: 0.50    Types: Cigarettes  . Smokeless tobacco: Never Used  Substance Use Topics  . Alcohol use: Yes  . Drug use: Never      Review of Systems Constitutional: No fever/chills Eyes: No visual changes. ENT: No sore throat. Cardiovascular: Denies chest pain. Respiratory: Denies shortness of breath. Gastrointestinal: Positive abdominal pain and rectal pain Genitourinary: Negative for dysuria. Musculoskeletal: Negative for back pain. Skin: Negative for rash. Neurological: Negative for headaches, focal weakness or numbness. All other ROS negative ____________________________________________   PHYSICAL EXAM:  VITAL SIGNS: Blood pressure (!) 180/92, pulse 77, temperature 98.1 F (36.7 C), temperature source Oral, resp. rate 18, height 5' 4.5" (1.638 m), weight 69.4 kg, SpO2 100 %.  Constitutional: Alert and oriented. Well appearing and in no acute distress. Eyes: Conjunctivae are normal. EOMI. Head: Atraumatic. Nose: No congestion/rhinnorhea. Mouth/Throat: Mucous membranes are moist.   Neck: No stridor. Trachea Midline. FROM Cardiovascular: Normal rate, regular rhythm. Grossly normal heart sounds.  Good peripheral circulation. Respiratory: Normal respiratory effort.  No retractions. Lungs CTAB. Gastrointestinal: Patient reports tenderness to her lower abdomen although no rebound or guarding no significant tenderness with palpation.  No distention. No abdominal bruits.  Musculoskeletal: No lower extremity tenderness nor edema.  No joint effusions. Neurologic:  Normal speech and language. No gross focal neurologic deficits are appreciated.  Skin:  Skin is warm, dry and  intact. No rash noted. Psychiatric: Mood and affect are normal. Speech and behavior are normal. GU: Rectal exam no evidence of abscess, hemorrhoid.  No stool ball. Back: No CT L-spine tenderness.  No CVA tenderness ____________________________________________   LABS (all labs ordered are listed, but only abnormal results are displayed)  Labs Reviewed  URINALYSIS, COMPLETE (UACMP) WITH MICROSCOPIC - Abnormal; Notable for the following components:      Result Value   Color, Urine STRAW (*)    APPearance CLEAR (*)    All other components within normal limits  LIPASE, BLOOD  COMPREHENSIVE METABOLIC PANEL  CBC   ____________________________________________ RADIOLOGY   Official radiology report(s): CT ABDOMEN PELVIS W CONTRAST  Result Date: 05/26/2019 CLINICAL DATA:  79 year old female with history of lower abdominal pain and abdominal distension. EXAM: CT ABDOMEN AND PELVIS WITH CONTRAST TECHNIQUE: Multidetector CT imaging of the abdomen and pelvis was performed using the standard protocol following bolus administration of  intravenous contrast. CONTRAST:  111mL OMNIPAQUE IOHEXOL 300 MG/ML  SOLN COMPARISON:  No priors. FINDINGS: Lower chest: Mild scarring in the lung bases bilaterally. Aortic atherosclerosis. Hepatobiliary: Subcentimeter low-attenuation lesion in segment 4B of the liver, too small to characterize, but favored to represent a cyst. 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 No other suspicious hepatic lesions. No intra or extrahepatic biliary ductal dilatation. Pancreas: No pancreatic mass. No pancreatic ductal dilatation. No pancreatic or peripancreatic fluid collections or inflammatory changes. Spleen: Normal appearance of the  spleen. Adrenals/Urinary Tract: Bilateral kidneys and bilateral adrenal glands are normal in appearance. No hydroureteronephrosis. Urinary bladder is normal in appearance. Stomach/Bowel: Normal appearance of the stomach. No pathologic dilatation of small bowel or colon. Numerous colonic diverticulae are noted, particularly in the sigmoid colon, without definite surrounding inflammatory changes to suggest an acute diverticulitis at this time. Normal appendix. Vascular/Lymphatic: Aortic atherosclerosis, without evidence of aneurysm or dissection in the abdominal or pelvic vasculature. No lymphadenopathy noted in the abdomen or pelvis. Reproductive: Uterus and ovaries are atrophic. Other: No significant volume of ascites.  No pneumoperitoneum. Musculoskeletal: There are no aggressive appearing lytic or blastic lesions noted in the visualized portions of the skeleton. IMPRESSION: 1. No acute findings are noted in the abdomen or pelvis to account for the patient's symptoms. 2. Severe colonic diverticulosis, without evidence of acute diverticulitis at this time. 3. 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 4. Aortic atherosclerosis. 5. Additional incidental findings, as above. Electronically Signed   By: Vinnie Langton M.D.   On: 05/26/2019 10:30    ____________________________________________   PROCEDURES  Procedure(s) performed (including Critical Care):  Procedures   ____________________________________________   INITIAL IMPRESSION / ASSESSMENT AND PLAN / ED COURSE  Audryana Perovich was evaluated in Emergency Department on 05/26/2019 for the symptoms described in the history of present illness. She was evaluated in the context of the global COVID-19 pandemic, which necessitated consideration that the patient might be at risk for infection with the SARS-CoV-2 virus that causes COVID-19. Institutional protocols and algorithms that pertain to  the evaluation of patients at risk for COVID-19 are in a state of rapid change based on information released by regulatory bodies including the CDC and federal and state organizations. These policies and algorithms were followed during the patient's care in the ED.     Patient is a 79 year old who presents with lower abdominal pain.  Given patient's age we will proceed with CT scan to rule out obstruction, perforation, diverticulitis, appendicitis.  Will get labs to evaluate for electrolyte abnormalities, AKI, UTI.  Rectal exam was performed and had no evidence of abscess or hemorrhoid.  Patient's labs are reassuring.  No electrolyte abnormalities, no AKI.  CT imaging was negative.  Patient was updated on some incidental findings.  Reevaluated patient abdomen continues to be soft and nontender.  Patient is feeling better after the IV pain medications.  Will get urine.  UA without evidence of UTI.  Patient feels comfortable with discharge home and can follow-up with PCP.  I discussed the provisional nature of ED diagnosis, the treatment so far, the ongoing plan of care, follow up appointments and return precautions with the patient and any family  or support people present. They expressed understanding and agreed with the plan, discharged home.  ____________________________________________   FINAL CLINICAL IMPRESSION(S) / ED DIAGNOSES   Final diagnoses:  Lower abdominal pain      MEDICATIONS GIVEN DURING THIS VISIT:  Medications  fentaNYL (SUBLIMAZE) injection 50 mcg (50 mcg Intravenous Given 05/26/19 0920)  ondansetron (ZOFRAN) injection 4 mg (4 mg Intravenous Given 05/26/19 0919)  acetaminophen (TYLENOL) tablet 1,000 mg (1,000 mg Oral Given 05/26/19 0919)  iohexol (OMNIPAQUE) 300 MG/ML solution 100 mL (100 mLs Intravenous Contrast Given 05/26/19 1011)  ketorolac (TORADOL) 30 MG/ML injection 15 mg (15 mg Intravenous Given 05/26/19 1102)     ED Discharge Orders    None       Note:   This document was prepared using Dragon voice recognition software and may include unintentional dictation errors.   Vanessa Marinette, MD 05/26/19 [phone]

## 2019-05-26 NOTE — Discharge Instructions (Addendum)
CT scan was reassuring.  There was some incidental findings as below that you follow-up with your primary care doctor.  Return to the ER for worsening pain, fevers or any other concerns otherwise you to follow-up with your primary doctor.  You take Tylenol 1 g every 8 hours for your pain     IMPRESSION:  1. No acute findings are noted in the abdomen or pelvis to account  for the patient's symptoms.  2. Severe colonic diverticulosis, without evidence of acute  diverticulitis at this time.  3. 2.6 x 1.5 x 1.7 cm lesion in segment 7 of the liver strongly  favored to represent a cavernous hemangioma.  4. Aortic atherosclerosis.  5. Additional incidental findings, as above.

## 2019-05-26 NOTE — ED Triage Notes (Signed)
Pt arrived via POV with daughter, reports abdominal pain off and on for several weeks, states she feels bloated and has been constipated.  Pt states she took mag citrate and laxatives, but states minimal relief. Pt states her stools have been more narrow than normal.  Pt states she has lost 10lbs since July.  Pt also reports rectal pain and back pain. Pt states she had injection recently in back, but states the pain has been worse since then.  Pt reports hx of breast CA with lumpectomy.

## 2019-05-26 NOTE — ED Triage Notes (Signed)
FIRST NURSE NOTE:  Pt c/o abdominal pain, back pain, and pain to anal area per patient. Pt ambulatory without difficulty.

## 2019-05-27 NOTE — Telephone Encounter (Signed)
Refill request for general medication.  Last office visit:11/27/18  No follow-ups on file.

## 2019-05-28 ENCOUNTER — Other Ambulatory Visit: Payer: Self-pay | Admitting: Family Medicine

## 2019-05-28 DIAGNOSIS — C50912 Malignant neoplasm of unspecified site of left female breast: Secondary | ICD-10-CM

## 2019-05-28 DIAGNOSIS — Z79810 Long term (current) use of selective estrogen receptor modulators (SERMs): Secondary | ICD-10-CM

## 2019-05-29 NOTE — Telephone Encounter (Signed)
Pt notified, already seeing oncologist

## 2019-06-06 ENCOUNTER — Ambulatory Visit: Payer: Medicare Other

## 2019-06-07 ENCOUNTER — Ambulatory Visit (INDEPENDENT_AMBULATORY_CARE_PROVIDER_SITE_OTHER): Payer: Medicare Other | Admitting: Family Medicine

## 2019-06-07 ENCOUNTER — Encounter: Payer: Self-pay | Admitting: Family Medicine

## 2019-06-07 ENCOUNTER — Ambulatory Visit: Payer: Medicare Other

## 2019-06-07 ENCOUNTER — Other Ambulatory Visit: Payer: Self-pay

## 2019-06-07 VITALS — BP 108/80 | HR 97 | Temp 97.9°F | Resp 14 | Ht 65.0 in | Wt 157.8 lb

## 2019-06-07 DIAGNOSIS — E785 Hyperlipidemia, unspecified: Secondary | ICD-10-CM

## 2019-06-07 DIAGNOSIS — F17200 Nicotine dependence, unspecified, uncomplicated: Secondary | ICD-10-CM

## 2019-06-07 DIAGNOSIS — E538 Deficiency of other specified B group vitamins: Secondary | ICD-10-CM | POA: Diagnosis not present

## 2019-06-07 DIAGNOSIS — I700 Atherosclerosis of aorta: Secondary | ICD-10-CM

## 2019-06-07 DIAGNOSIS — F325 Major depressive disorder, single episode, in full remission: Secondary | ICD-10-CM

## 2019-06-07 DIAGNOSIS — G459 Transient cerebral ischemic attack, unspecified: Secondary | ICD-10-CM

## 2019-06-07 DIAGNOSIS — C50912 Malignant neoplasm of unspecified site of left female breast: Secondary | ICD-10-CM

## 2019-06-07 DIAGNOSIS — I10 Essential (primary) hypertension: Secondary | ICD-10-CM

## 2019-06-07 DIAGNOSIS — Z170 Estrogen receptor positive status [ER+]: Secondary | ICD-10-CM

## 2019-06-07 DIAGNOSIS — K219 Gastro-esophageal reflux disease without esophagitis: Secondary | ICD-10-CM

## 2019-06-07 DIAGNOSIS — E7800 Pure hypercholesterolemia, unspecified: Secondary | ICD-10-CM | POA: Diagnosis not present

## 2019-06-07 MED ORDER — ATORVASTATIN CALCIUM 40 MG PO TABS: 40.0000 mg | ORAL_TABLET | Freq: Every day | ORAL | 3 refills | Status: AC

## 2019-06-07 MED ORDER — VALSARTAN-HYDROCHLOROTHIAZIDE 80-12.5 MG PO TABS: 1.0000 | ORAL_TABLET | Freq: Every day | ORAL | 3 refills | Status: AC

## 2019-06-07 MED ORDER — OMEPRAZOLE 20 MG PO CPDR: 20.0000 mg | DELAYED_RELEASE_CAPSULE | Freq: Every day | ORAL | 3 refills | Status: AC

## 2019-06-07 MED ORDER — CLOPIDOGREL BISULFATE 75 MG PO TABS: 75.0000 mg | ORAL_TABLET | Freq: Every day | ORAL | 3 refills | Status: AC

## 2019-06-07 NOTE — Progress Notes (Signed)
[place]  [phone] [phone]  ID: Heather Carpenter OB: 14-Jul-1940  MR#: [id number]  [id number]  Patient Care Team: Delsa Grana, PA-C as PCP - General (Family Medicine) Kate Sable, MD as PCP - Cardiology (Cardiology)  CHIEF COMPLAINT: Left breast cancer.  INTERVAL HISTORY: Patient is a 79 year old female who was diagnosed with a left breast cancer in 2018 in [place].  She underwent lumpectomy at that time.  She did not require chemotherapy.  XRT was discussed, but ultimately patient declined.  She was placed on letrozole and continues to tolerate treatment well without significant side effects.  She is referred to establish care.  She currently feels well and is asymptomatic.  She has no neurologic complaints.  She denies any recent fevers or illnesses.  She has chronic back pain.  She has a good appetite and denies weight loss.  She has no chest pain, shortness of breath, cough, or hemoptysis.  She denies any nausea, vomiting, constipation, or diarrhea.  She has no urinary complaints.  Patient feels at her baseline offers no specific complaints today.  REVIEW OF SYSTEMS:   Review of Systems  Constitutional: Negative.  Negative for fever, malaise/fatigue and weight loss.  Respiratory: Negative.  Negative for cough, hemoptysis and shortness of breath.   Cardiovascular: Negative.  Negative for chest pain and leg swelling.  Gastrointestinal: Negative.  Negative for abdominal pain.  Genitourinary: Negative.  Negative for dysuria.  Musculoskeletal: Positive for back pain.  Skin: Negative.  Negative for rash.  Neurological: Negative.  Negative for dizziness, focal weakness, weakness and headaches.  Psychiatric/Behavioral: Negative.  The patient is not nervous/anxious.     As per HPI. Otherwise, a complete review of systems is negative.  PAST MEDICAL HISTORY: Past Medical History:  Diagnosis Date  . Anemia   . Arthritis   . Breast  cancer ([place]) 2018   left breast lumpectomy  . Depression   . GERD (gastroesophageal reflux disease)   . Hx-TIA (transient ischemic attack)   . Hyperlipidemia   . Hypertension   . Moderate aortic stenosis by prior echocardiogram     PAST SURGICAL HISTORY: Past Surgical History:  Procedure Laterality Date  . BREAST BIOPSY Left 12/16/2016   [place] bx of mass at 12:00  . BREAST BIOPSY Left ? DATE   BENIGN, MARKER IN BREAST  . BREAST BIOPSY Right YRS AGO   BENIGN  . BREAST LUMPECTOMY Left 12/30/2016   left breast ca at 12:00 and LN excisied  . CATARACT EXTRACTION    . collapsed lung     . CYST EXCISION      FAMILY HISTORY: Family History  Problem Relation Age of Onset  . Stroke Mother   . Arthritis Mother   . Heart failure Father   . Prostate cancer Brother   . Depression Daughter   . Heart murmur Son   . Breast cancer Daughter 76    ADVANCED DIRECTIVES (Y/N):  N  HEALTH MAINTENANCE: Social History   Tobacco Use  . Smoking status: Current Every Day Smoker    Packs/day: 0.50    Types: Cigarettes  . Smokeless tobacco: Never Used  Substance Use Topics  . Alcohol use: Yes    Alcohol/week: 7.0 standard drinks    Types: 7 Glasses of wine per week  . Drug use: Never     Colonoscopy:  PAP:  Bone density:  Lipid panel:  No Known Allergies  Current Outpatient Medications  Medication Sig Dispense Refill  . atorvastatin (LIPITOR)  40 MG tablet Take 1 tablet (40 mg total) by mouth daily. 90 tablet 3  . Calcium Carbonate-Vitamin D 600-200 MG-UNIT TABS Take 1 tablet by mouth daily.    . cholecalciferol (VITAMIN D3) 25 MCG (1000 UT) tablet Take 2,000 Units by mouth daily.    . clopidogrel (PLAVIX) 75 MG tablet Take 1 tablet (75 mg total) by mouth daily. 90 tablet 3  . letrozole (FEMARA) 2.5 MG tablet Take 1 tablet (2.5 mg total) by mouth daily. 90 tablet 3  . Omega-3 Fatty Acids (FISH OIL) 1000 MG CAPS Take by mouth.    Marland Kitchen omeprazole (PRILOSEC) 20 MG capsule Take 1 capsule  (20 mg total) by mouth daily. 90 capsule 3  . valsartan-hydrochlorothiazide (DIOVAN-HCT) 80-12.5 MG tablet Take 1 tablet by mouth daily. 90 tablet 3  . vitamin B-12 (CYANOCOBALAMIN) 1000 MCG tablet Take 1,000 mcg by mouth daily.     No current facility-administered medications for this visit.    OBJECTIVE: Vitals:   06/10/19 1332  BP: 120/83  Pulse: 97  Temp: (!) 97.4 F (36.3 C)     Body mass index is 26.89 kg/m.    ECOG FS:0 - Asymptomatic  General: Well-developed, well-nourished, no acute distress. Eyes: Pink conjunctiva, anicteric sclera. HEENT: Normocephalic, moist mucous membranes. Breast: Exam deferred today. Lungs: No audible wheezing or coughing. Heart: Regular rate and rhythm. Abdomen: Soft, nontender, no obvious distention. Musculoskeletal: No edema, cyanosis, or clubbing. Neuro: Alert, answering all questions appropriately. Cranial nerves grossly intact. Skin: No rashes or petechiae noted. Psych: Normal affect. Lymphatics: No cervical, calvicular, axillary or inguinal LAD.   LAB RESULTS:  Lab Results  Component Value Date   NA 138 05/26/2019   K 3.6 05/26/2019   CL 101 05/26/2019   CO2 24 05/26/2019   GLUCOSE 98 05/26/2019   BUN 15 05/26/2019   CREATININE 0.72 05/26/2019   CALCIUM 9.3 05/26/2019   PROT 6.6 05/26/2019   ALBUMIN 4.0 05/26/2019   AST 20 05/26/2019   ALT 19 05/26/2019   ALKPHOS 66 05/26/2019   BILITOT 0.9 05/26/2019   GFRNONAA >60 05/26/2019   GFRAA >60 05/26/2019    Lab Results  Component Value Date   WBC 9.2 05/26/2019   NEUTROABS 2,981 12/14/2018   HGB 13.2 05/26/2019   HCT 39.3 05/26/2019   MCV 95.9 05/26/2019   PLT 259 05/26/2019     STUDIES: CT ABDOMEN PELVIS W CONTRAST  Result Date: 05/26/2019 CLINICAL DATA:  79 year old female with history of lower abdominal pain and abdominal distension. EXAM: CT ABDOMEN AND PELVIS WITH CONTRAST TECHNIQUE: Multidetector CT imaging of the abdomen and pelvis was performed using the  standard protocol following bolus administration of intravenous contrast. CONTRAST:  136mL OMNIPAQUE IOHEXOL 300 MG/ML  SOLN COMPARISON:  No priors. FINDINGS: Lower chest: Mild scarring in the lung bases bilaterally. Aortic atherosclerosis. Hepatobiliary: Subcentimeter low-attenuation lesion in segment 4B of the liver, too small to characterize, but favored to represent a cyst. 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 No other suspicious hepatic lesions. No intra or extrahepatic biliary ductal dilatation. Pancreas: No pancreatic mass. No pancreatic ductal dilatation. No pancreatic or peripancreatic fluid collections or inflammatory changes. Spleen: Normal appearance of the spleen. Adrenals/Urinary Tract: Bilateral kidneys and bilateral adrenal glands are normal in appearance. No hydroureteronephrosis.  Urinary bladder is normal in appearance. Stomach/Bowel: Normal appearance of the stomach. No pathologic dilatation of small bowel or colon. Numerous colonic diverticulae are noted, particularly in the sigmoid colon, without definite surrounding inflammatory changes to suggest an acute diverticulitis at this time. Normal appendix. Vascular/Lymphatic: Aortic atherosclerosis, without evidence of aneurysm or dissection in the abdominal or pelvic vasculature. No lymphadenopathy noted in the abdomen or pelvis. Reproductive: Uterus and ovaries are atrophic. Other: No significant volume of ascites.  No pneumoperitoneum. Musculoskeletal: There are no aggressive appearing lytic or blastic lesions noted in the visualized portions of the skeleton. IMPRESSION: 1. No acute findings are noted in the abdomen or pelvis to account for the patient's symptoms. 2. Severe colonic  diverticulosis, without evidence of acute diverticulitis at this time. 3. 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 4. Aortic atherosclerosis. 5. Additional incidental findings, as above. Electronically Signed   By: Vinnie Langton M.D.   On: 05/26/2019 10:30    ASSESSMENT: Left breast cancer  PLAN:   1. Left breast cancer: Unclear stage.  Will attempt to obtain patient's records from [place], [place].  Patient underwent lumpectomy in approximately October 2018.  She did not require adjuvant chemotherapy and declined adjuvant XRT.  She was placed on letrozole at that time and is tolerating treatments well.  She will require a minimum of 5 years of treatment completing in October 2023.  Her most recent mammogram on January 07, 2019 was reported as BI-RADS 1.  Repeat in October 2021.  No intervention is needed at this time.  Return to clinic in 6 months for routine evaluation. 2.  Osteopenia: Patient states that she has had bone mineral density study in the past, but cannot remember her most recent results.  Will repeat in the next 1 to 2 weeks. 3.  Back pain: Patient reports she has an MRI in the near future for further evaluation.  I spent a total of 45 minutes reviewing chart data, face-to-face evaluation with the patient, counseling and coordination of care as detailed above.   Patient expressed understanding and was in agreement with this plan. She also understands that She can call clinic at any time with any questions, concerns, or complaints.   Cancer Staging No matching staging information was found for the patient.  Lloyd Huger, MD   06/10/2019 2:31 PM

## 2019-06-07 NOTE — Patient Instructions (Signed)
If your blood pressure is lower and you feel bad or lightheaded, then cut your BP pill in half and monitor it for a week or two.  Let me know if your BP is high or low or if you changed the medications.

## 2019-06-07 NOTE — Progress Notes (Signed)
Name: Heather Carpenter   MRN: [id number]    DOB: 21-Apr-1940   Date:06/07/2019       Progress Note  Chief Complaint  Patient presents with  . Follow-up  . Hypertension  . Hyperlipidemia  . Gastroesophageal Reflux     Subjective:   Heather Carpenter is a 79 y.o. female, presents to clinic for routine follow up on routine conditions, she did recently establish care here after [place] the state.  Our initial visit in September showed that her blood pressure and cholesterol well controlled on her current regimen, she has history of breast cancer and is on letrozole, also history of TIA versus stroke she is not sure otherwise she has B12 deficiency acid reflux and is compliant with supplements for vitamin D and calcium.  We were try to get her established with a cardiologist locally and oncology.  She has established with: Cardiology, recently did mammogram and ultrasound imaging and is currently getting connected with an oncologist, I have recently refilled her letrozole medication but we will need a specialist to help eval and monitor her.  She went to the ER about 2 weeks ago with abdominal pain.  She had a CT scan and blood work, urine and blood work was unremarkable we reviewed it together today, her CT scan abdomen pelvis showed a lesion in her liver suspected to be a hemangioma, did show aortic atherosclerosis, severe diverticulosis without diverticulitis and no other acute or concerning findings.  We reviewed her CT together today as well.  She reports that she had been having severe abdominal pain, back pain and rectal pain.  ER MDM was as follows: Patient is a 79 year old who presents with lower abdominal pain.  Given patient's age we will proceed with CT scan to rule out obstruction, perforation, diverticulitis, appendicitis.  Will get labs to evaluate for electrolyte abnormalities, AKI, UTI.  Rectal exam was performed and had no evidence of abscess or hemorrhoid.  Patient's labs are  reassuring.  No electrolyte abnormalities, no AKI.  CT imaging was negative.  Patient was updated on some incidental findings.  Reevaluated patient abdomen continues to be soft and nontender.  Patient is feeling better after the IV pain medications.  Will get urine.  UA without evidence of UTI.  Patient feels comfortable with discharge home and can follow-up with PCP.  She states her symptoms from the ER visit have resolved she is having normal bowel movements denies any bloating, abdominal pain, back pain or change in stool caliber.  She does have a history of esophageal dilatation and was previously referred to GI Her blood pressure was a little elevated in the ER but there were no medications changed.  She reported to them a 10 pound weight loss, she did not mention that today and did not seem concerned.  Hypertension:  Her blood pressure is slightly lower today from what her baseline is usually around 120/80 and she notes that she does , she feels a little lightheaded.  She did check her blood pressure yesterday at home and it was 118/80. Currently managed on valsartan hydrochlorothiazide 80-12.5mg  once daily Pt reports good med compliance and denies any SE. Blood pressure today is soft, but well controlled. BP Readings from Last 3 Encounters:  06/07/19 108/80  05/26/19 (!) 149/115  02/28/19 120/80   she denies hypotension, syncope.CP, SOB, exertional sx, LE edema, palpitation, Ha's, visual disturbances CMP was done in the ER about 2 weeks ago reviewed that today her electrolytes were  normal and her kidney function was great Lab Results  Component Value Date   CREATININE 0.72 05/26/2019   Lab Results  Component Value Date   BUN 15 05/26/2019   Lab Results  Component Value Date   GFRNONAA >60 05/26/2019  She has established with Dr. Garen Lah for monitoring of her aortic stenosis: "Patient recently moved from [place] to the area.  She has a known history of aortic valve  stenosis.  Last echo performed on 09/11/2018.  Report showed moderate aortic valve stenosis with mean gradient 21 mmHg, EF 70 to 75%.  States doing okay, denies chest pain, palpitations, shortness of breath, orthopnea, presyncope, syncope.  She takes Plavix due to history of TIA.  Repeat echocardiogram was ordered" Echo was done on February 21, 2019 Moderate aortic stenosis, normal ejection fraction, patient is going to follow-up with cardiology every 6 months, continues to have no change to her symptoms denies any chest pain palpitations exertional shortness of breath orthopnea lower extremity edema.   Hyperlipidemia:   Aortic atherosclerosis found on recent CT scan, this has been added into her problem list, she also has history of stroke or TIA she is not sure, she is compliant with Lipitor 40 mg clopidogrel 75 mg daily no side effects or concerns Last Lipids: Lab Results  Component Value Date   CHOL 126 12/14/2018   HDL 48 (L) 12/14/2018   LDLCALC 55 12/14/2018   TRIG 146 12/14/2018   CHOLHDL 2.6 12/14/2018  - Denies: Chest pain, shortness of breath, myalgias. - Documented aortic atherosclerosis? Yes - Risk factors for atherosclerosis: diabetes mellitus, hypercholesterolemia, hypertension and smoking Overall she eats what she wants to -no particular diet regimen   MDD- pt reports no sx, she has 2eaned off venlafaxine - feeling good, decreased from 75 tp 37.5 and then took every other day 37.5 mg  She is supplementing with fish oil which she says she read would help with brain fog Depression screen [facility] 2/9 06/07/2019 12/07/2018  Decreased Interest 0 0  Down, Depressed, Hopeless 1 0  PHQ - 2 Score 1 0  Altered sleeping 0 0  Tired, decreased energy 1 1  Change in appetite 0 1  Feeling bad or failure about yourself  0 0  Trouble concentrating 0 0  Moving slowly or fidgety/restless 0 0  Suicidal thoughts 0 0  PHQ-9 Score 2 2  Difficult doing work/chores Not difficult at all Not difficult  at all  PHQ-9 reviewed today negative   GERD-managed with daily omeprazole 20 mg once daily, she reports that she has no problems unless she tries to skip taking the omeprazole.  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 Gastroesophageal reflux disease 12/12/2018  . Malignant neoplasm of left breast in female, estrogen receptor positive ([place]) 12/12/2018  . Hyperlipidemia 12/12/2018  . Essential hypertension 12/12/2018  . Current smoker 12/12/2018  . Vitamin B12 deficiency 12/12/2018  . Major depressive disorder with current active episode 12/12/2018  . Aortic valve stenosis 12/12/2018  . History of esophageal dilatation 12/12/2018  . TIA on medication 12/12/2018    Past Surgical History:  Procedure Laterality Date  . BREAST BIOPSY Left 12/16/2016   [place] bx of mass at 12:00  . BREAST BIOPSY Left ? DATE   BENIGN, MARKER IN BREAST  . BREAST BIOPSY Right YRS AGO   BENIGN  . BREAST LUMPECTOMY Left 12/30/2016   left breast ca at 12:00 and LN excisied  . CATARACT EXTRACTION    . collapsed lung     . CYST EXCISION      Family History  Problem Relation Age of Onset  . Stroke Mother   . Arthritis Mother   . Heart failure Father   . Prostate cancer Brother   . Depression Daughter   . Heart murmur Son   . Breast cancer Daughter 42    Social History   Tobacco Use  . Smoking status: Current Every Day Smoker    Packs/day: 0.50    Types: Cigarettes  . Smokeless tobacco: Never Used  Substance Use Topics  . Alcohol use: Yes  . Drug use: Never      Current Outpatient Medications:  .  atorvastatin  (LIPITOR) 40 MG tablet, Take 40 mg by mouth daily., Disp: , Rfl:  .  Calcium Carbonate-Vitamin D 600-200 MG-UNIT TABS, Take 1 tablet by mouth daily., Disp: , Rfl:  .  cholecalciferol (VITAMIN D3) 25 MCG (1000 UT) tablet, Take 2,000 Units by mouth daily., Disp: , Rfl:  .  clopidogrel (PLAVIX) 75 MG tablet, Take 75 mg by mouth daily., Disp: , Rfl:  .  letrozole (FEMARA) 2.5 MG tablet, TAKE 1 TABLET BY MOUTH  DAILY, Disp: 90 tablet, Rfl: 0 .  Omega-3 Fatty Acids (FISH OIL) 1000 MG CAPS, Take by mouth., Disp: , Rfl:  .  omeprazole (PRILOSEC) 20 MG capsule, Take 20 mg by mouth daily., Disp: , Rfl:  .  valsartan-hydrochlorothiazide (DIOVAN-HCT) 80-12.5 MG tablet, Take 1 tablet by mouth daily., Disp: , Rfl:  .  vitamin B-12 (CYANOCOBALAMIN) 1000 MCG tablet, Take 1,000 mcg by mouth daily., Disp: , Rfl:  .  venlafaxine XR (EFFEXOR-XR) 37.5 MG 24 hr capsule, Take 2 cap (75 mg) PO qam on odd days and take 1 cap (37.5 mg) PO qam on even days for 2 weeks, then take 1 cap po qam daily for 2 w (Patient taking differently: every other day. Take 2 cap (75 mg) PO qam on odd days and take 1 cap (37.5 mg) PO qam on even days for 2 weeks, then take 1 cap po qam daily for 2 w), Disp: 45 capsule, Rfl: 1  No Known Allergies  Chart Review Today: I have reviewed the patient's medical history in detail and updated the computerized patient record.   Review of Systems  10 Systems reviewed and are negative for acute change except as noted in the HPI.  Objective:    Vitals:   06/07/19 1100  BP: 108/80  Pulse: 97  Resp: 14  Temp: 97.9 F (36.6 C)  SpO2: 96%  Weight: 157 lb 12.8 oz (71.6 kg)  Height: 5\' 5"  (1.651 m)    Body mass index is 26.26 kg/m.  Physical Exam Vitals and nursing note reviewed.  Constitutional:      General: She is not in acute distress.    Appearance: Normal appearance. She is well-developed. She is not ill-appearing, toxic-appearing or diaphoretic.     Interventions: Face mask in  place.  HENT:     Head: Normocephalic and atraumatic.     Right Ear: External ear normal.     Left Ear: External  ear normal.  Eyes:     General: Lids are normal. No scleral icterus.       Right eye: No discharge.        Left eye: No discharge.     Conjunctiva/sclera: Conjunctivae normal.  Neck:     Trachea: Phonation normal. No tracheal deviation.  Cardiovascular:     Rate and Rhythm: Normal rate and regular rhythm.     Pulses: Normal pulses.          Radial pulses are 2+ on the right side and 2+ on the left side.       Posterior tibial pulses are 2+ on the right side and 2+ on the left side.     Heart sounds: Normal heart sounds. No murmur. No friction rub. No gallop.   Pulmonary:     Effort: Pulmonary effort is normal. No respiratory distress.     Breath sounds: Normal breath sounds. No stridor. No wheezing, rhonchi or rales.  Chest:     Chest wall: No tenderness.  Abdominal:     General: Bowel sounds are normal. There is no distension.     Palpations: Abdomen is soft.     Tenderness: There is no abdominal tenderness. There is no guarding or rebound.  Musculoskeletal:        General: No deformity. Normal range of motion.     Cervical back: Normal range of motion and neck supple.     Right lower leg: No edema.     Left lower leg: No edema.  Lymphadenopathy:     Cervical: No cervical adenopathy.  Skin:    General: Skin is warm and dry.     Capillary Refill: Capillary refill takes less than 2 seconds.     Coloration: Skin is not jaundiced or pale.     Findings: No rash.  Neurological:     Mental Status: She is alert and oriented to person, place, and time.     Motor: No weakness or abnormal muscle tone.     Coordination: Coordination normal.     Gait: Gait normal.  Psychiatric:        Mood and Affect: Mood normal.        Speech: Speech normal.        Behavior: Behavior normal.         Fall Risk: Fall Risk  06/07/2019 12/07/2018  Falls in the past year? 0 0   Number falls in past yr: 0 0  Injury with Fall? 0 0    Functional Status Survey: Is the patient deaf or have difficulty hearing?: No Does the patient have difficulty seeing, even when wearing glasses/contacts?: No Does the patient have difficulty concentrating, remembering, or making decisions?: No Does the patient have difficulty walking or climbing stairs?: No Does the patient have difficulty dressing or bathing?: No Does the patient have difficulty doing errands alone such as visiting a doctor's office or shopping?: No   Assessment & Plan:    79 y/o white female presents for 6 month f/up on routine conditions after establishing care here 6 months ago     ICD-10-CM   1. Hyperlipidemia, unspecified hyperlipidemia type  E78.5 Lipid panel    atorvastatin (LIPITOR) 40 MG tablet   Compliant with statin no myalgias concerns or side effects, new aortic atherosclerosis and history of stroke managed with statin and Plavix  2. Pure hypercholesterolemia  E78.00 atorvastatin (LIPITOR) 40 MG tablet   Compliant with medication her last cholesterol panel was well controlled  we will repeat today  3. Vitamin B12 deficiency  E53.8    She continues oral supplement will screen CBC  4. Major depressive disorder in full remission, unspecified whether recurrent ([place])  F32.5    She has weaned off medications without any side effects or concerns mood good depression screening negative  5. Gastroesophageal reflux disease, unspecified whether esophagitis present  K21.9 omeprazole (PRILOSEC) 20 MG capsule   well controlled with omeprazole, sx worse with food triggers and if she stops meds  6. Current smoker  F17.200    Continued smoking, currently no desire to quit  7. Aortic atherosclerosis ([facility])  I70.0 atorvastatin (LIPITOR) 40 MG tablet   On statin, monitoring  8. Essential hypertension  I10 valsartan-hydrochlorothiazide (DIOVAN-HCT) 80-12.5 MG tablet   Well-controlled, BP a little soft today will  have patient monitor if it continues to be low or she is lightheaded will cut med dose in half  9. TIA on medication  G45.9 clopidogrel (PLAVIX) 75 MG tablet   On Plavix and statin  10. 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, recent imaging completed, she continues to be on letrozole, is scheduled to see oncology to establish care Z17.0    Wt Readings from Last 5 Encounters:  06/07/19 157 lb 12.8 oz (71.6 kg)  05/26/19 153 lb (69.4 kg)  02/28/19 165 lb (74.8 kg)  12/18/18 164 lb (74.4 kg)   BMI Readings from Last 5 Encounters:  06/07/19 26.26 kg/m  05/26/19 25.86 kg/m  02/28/19 27.88 kg/m  12/18/18 27.72 kg/m   She did have about 7-8 lbs weight loss since she first established care here we also did get her off Effexor which may have contributed to this her BMI is healthy at 26 she did not appear unwell or cachectic, will continue to monitor  Return for 6 months for HTN/HLD f/up .   Delsa Grana, PA-C 06/07/19 11:13 AM

## 2019-06-08 LAB — LIPID PANEL
Cholesterol: 160 mg/dL (ref <200)
HDL: 58 mg/dL (ref >=50)
LDL Cholesterol (Calc): 76 mg/dL (calc)
Non-HDL Cholesterol (Calc): 102 mg/dL (calc) (ref <130)
Total CHOL/HDL Ratio: 2.8 (calc) (ref <5.0)
Triglycerides: 166 mg/dL — ABNORMAL HIGH (ref <150)

## 2019-06-10 ENCOUNTER — Other Ambulatory Visit: Payer: Self-pay

## 2019-06-10 ENCOUNTER — Encounter: Payer: Self-pay | Admitting: Oncology

## 2019-06-10 ENCOUNTER — Inpatient Hospital Stay: Payer: Medicare Other | Attending: Oncology | Admitting: Oncology

## 2019-06-10 VITALS — BP 120/83 | HR 97 | Temp 97.4°F | Wt 161.6 lb

## 2019-06-10 DIAGNOSIS — I350 Nonrheumatic aortic (valve) stenosis: Secondary | ICD-10-CM | POA: Diagnosis not present

## 2019-06-10 DIAGNOSIS — I700 Atherosclerosis of aorta: Secondary | ICD-10-CM | POA: Diagnosis not present

## 2019-06-10 DIAGNOSIS — F329 Major depressive disorder, single episode, unspecified: Secondary | ICD-10-CM | POA: Insufficient documentation

## 2019-06-10 DIAGNOSIS — Z803 Family history of malignant neoplasm of breast: Secondary | ICD-10-CM | POA: Diagnosis not present

## 2019-06-10 DIAGNOSIS — C50912 Malignant neoplasm of unspecified site of left female breast: Secondary | ICD-10-CM | POA: Diagnosis not present

## 2019-06-10 DIAGNOSIS — Z170 Estrogen receptor positive status [ER+]: Secondary | ICD-10-CM | POA: Diagnosis not present

## 2019-06-10 DIAGNOSIS — E785 Hyperlipidemia, unspecified: Secondary | ICD-10-CM | POA: Insufficient documentation

## 2019-06-10 DIAGNOSIS — M129 Arthropathy, unspecified: Secondary | ICD-10-CM | POA: Diagnosis not present

## 2019-06-10 DIAGNOSIS — Z79899 Other long term (current) drug therapy: Secondary | ICD-10-CM | POA: Diagnosis not present

## 2019-06-10 DIAGNOSIS — Z7982 Long term (current) use of aspirin: Secondary | ICD-10-CM | POA: Insufficient documentation

## 2019-06-10 DIAGNOSIS — M549 Dorsalgia, unspecified: Secondary | ICD-10-CM | POA: Diagnosis not present

## 2019-06-10 DIAGNOSIS — Z79811 Long term (current) use of aromatase inhibitors: Secondary | ICD-10-CM | POA: Diagnosis not present

## 2019-06-10 DIAGNOSIS — M8580 Other specified disorders of bone density and structure, unspecified site: Secondary | ICD-10-CM | POA: Insufficient documentation

## 2019-06-10 DIAGNOSIS — K7689 Other specified diseases of liver: Secondary | ICD-10-CM | POA: Insufficient documentation

## 2019-06-10 DIAGNOSIS — Z8673 Personal history of transient ischemic attack (TIA), and cerebral infarction without residual deficits: Secondary | ICD-10-CM | POA: Diagnosis not present

## 2019-06-10 DIAGNOSIS — I10 Essential (primary) hypertension: Secondary | ICD-10-CM | POA: Diagnosis not present

## 2019-06-10 DIAGNOSIS — F17210 Nicotine dependence, cigarettes, uncomplicated: Secondary | ICD-10-CM | POA: Insufficient documentation

## 2019-06-10 MED ORDER — LETROZOLE 2.5 MG PO TABS: 2.5000 mg | ORAL_TABLET | Freq: Every day | ORAL | 3 refills | Status: AC

## 2019-06-10 NOTE — Progress Notes (Signed)
Been feeling a little light headed, provider told patient to take 1/2 pill of bp medication.

## 2019-06-13 ENCOUNTER — Other Ambulatory Visit: Payer: Medicare Other

## 2019-09-20 ENCOUNTER — Encounter: Payer: Medicare Other | Admitting: Internal Medicine

## 2019-09-20 ENCOUNTER — Other Ambulatory Visit: Payer: Self-pay

## 2019-09-20 ENCOUNTER — Telehealth: Payer: Self-pay

## 2019-09-20 NOTE — Telephone Encounter (Signed)
Patient informed, she will follow directions as instructed by Dr. Roxan Hockey. Patient will call on Tuesday 09/24/19 to report if she is improving.

## 2019-09-20 NOTE — Telephone Encounter (Signed)
Yes, please let the patient know that I agree and I do think it is appropriate to monitor symptoms presently.  She should stay well-hydrated, stay very bland with her diet, and can use an Imodium type product as needed for diarrhea in the short-term. If she develops higher fevers, more severe abdominal pains, significant increases in diarrheal concerns with any bleeding concerns or any nausea or vomiting, should be assessed more urgently. I would also ask her to call us early next week to let us know how she is doing. Thanks, [facility]

## 2019-09-20 NOTE — Progress Notes (Signed)
No patient visit, see message

## 2019-09-20 NOTE — Telephone Encounter (Signed)
Called patient to triage for 10:00 telephone appointment. Mid way through work up patient states she would like to monitor symtpoms and would like me to check with Dr. Roxan Hockey to confirm this is ok. Patient started to have diarrhea(yellow in color) at 2am this morning and again at 5am (she was unaware it was happening). The patient thinks she may have ate some bad chicken salad.

## 2019-09-25 ENCOUNTER — Other Ambulatory Visit: Payer: Self-pay

## 2019-09-25 ENCOUNTER — Emergency Department
Admission: EM | Admit: 2019-09-25 | Discharge: 2019-09-25 | Disposition: A | Discharge status: Home / Self Care | Payer: Medicare Other | Attending: Emergency Medicine | Admitting: Emergency Medicine

## 2019-09-25 DIAGNOSIS — Z79899 Other long term (current) drug therapy: Secondary | ICD-10-CM | POA: Insufficient documentation

## 2019-09-25 DIAGNOSIS — R197 Diarrhea, unspecified: Secondary | ICD-10-CM | POA: Diagnosis not present

## 2019-09-25 DIAGNOSIS — F17210 Nicotine dependence, cigarettes, uncomplicated: Secondary | ICD-10-CM | POA: Diagnosis not present

## 2019-09-25 DIAGNOSIS — I10 Essential (primary) hypertension: Secondary | ICD-10-CM | POA: Diagnosis not present

## 2019-09-25 DIAGNOSIS — R1032 Left lower quadrant pain: Secondary | ICD-10-CM | POA: Diagnosis not present

## 2019-09-25 LAB — COMPREHENSIVE METABOLIC PANEL
ALT: 26 U/L (ref 0–44)
AST: 25 U/L (ref 15–41)
Albumin: 4.1 g/dL (ref 3.5–5.0)
Alkaline Phosphatase: 59 U/L (ref 38–126)
Anion gap: 10 (ref 5–15)
BUN: 10 mg/dL (ref 8–23)
CO2: 27 mmol/L (ref 22–32)
Calcium: 9.3 mg/dL (ref 8.9–10.3)
Chloride: 102 mmol/L (ref 98–111)
Creatinine, Ser: 0.75 mg/dL (ref 0.44–1.00)
GFR calc Af Amer: >60 mL/min (ref >60)
GFR calc non Af Amer: >60 mL/min (ref >60)
Glucose, Bld: 102 mg/dL — ABNORMAL HIGH (ref 70–99)
Potassium: 3.1 mmol/L — ABNORMAL LOW (ref 3.5–5.1)
Sodium: 139 mmol/L (ref 135–145)
Total Bilirubin: 0.8 mg/dL (ref 0.3–1.2)
Total Protein: 6.8 g/dL (ref 6.5–8.1)

## 2019-09-25 LAB — CBC
HCT: 37.7 % (ref 36.0–46.0)
Hemoglobin: 13.1 g/dL (ref 12.0–15.0)
MCH: 32 pg (ref 26.0–34.0)
MCHC: 34.7 g/dL (ref 30.0–36.0)
MCV: 92 fL (ref 80.0–100.0)
Platelets: 324 10*3/uL (ref 150–400)
RBC: 4.1 MIL/uL (ref 3.87–5.11)
RDW: 12 % (ref 11.5–15.5)
WBC: 5.8 10*3/uL (ref 4.0–10.5)
nRBC: 0 % (ref 0.0–0.2)

## 2019-09-25 LAB — URINALYSIS, COMPLETE (UACMP) WITH MICROSCOPIC
Bacteria, UA: NONE SEEN
Bilirubin Urine: NEGATIVE
Glucose, UA: NEGATIVE mg/dL
Ketones, ur: NEGATIVE mg/dL
Nitrite: NEGATIVE
Protein, ur: NEGATIVE mg/dL
Specific Gravity, Urine: 1.012 (ref 1.005–1.030)
pH: 5 (ref 5.0–8.0)

## 2019-09-25 LAB — LIPASE, BLOOD: Lipase: 29 U/L (ref 11–51)

## 2019-09-25 MED ORDER — SODIUM CHLORIDE 0.9% FLUSH: 3.0000 mL | Freq: Once | INTRAVENOUS | Status: DC

## 2019-09-25 MED ORDER — FLUCONAZOLE 150 MG PO TABS: 150.0000 mg | ORAL_TABLET | Freq: Every day | ORAL | 0 refills | Status: AC

## 2019-09-25 MED ORDER — VENLAFAXINE HCL ER 37.5 MG PO CP24: 37.5000 mg | ORAL_CAPSULE | Freq: Every day | ORAL | 2 refills | Status: DC

## 2019-09-25 MED ORDER — AMOXICILLIN-POT CLAVULANATE 875-125 MG PO TABS: 1.0000 | ORAL_TABLET | Freq: Two times a day (BID) | ORAL | 0 refills | Status: AC

## 2019-09-25 NOTE — ED Provider Notes (Signed)
[facility] Emergency Department Provider Note   ____________________________________________    I have reviewed the triage vital signs and the nursing notes.   HISTORY  Chief Complaint Abdominal Pain     HPI Heather Carpenter is a 79 y.o. female who presents for multiple complaints.  She reports some left lower quadrant abdominal pain which has been mild over the last week.  She was having diarrhea but that has improved significantly.  Primarily she complains of mood lability, decreased energy and frequent tearfulness.  She does have a long history of being on Effexor, was weaned down and stop the medication months ago.  She thinks that she may need to be on it again.  She is also stopped smoking recently.  Denies chest pain.  No shortness of breath.  No cough.  No fevers or chills.  No dysuria.  Past Medical History:  Diagnosis Date  . Anemia   . Arthritis   . Breast cancer ([place]) 2018   left breast lumpectomy  . Depression   . GERD (gastroesophageal reflux disease)   . Hx-TIA (transient ischemic attack)   . Hyperlipidemia   . Hypertension   . Moderate aortic stenosis by prior echocardiogram     Patient Active Problem List   Diagnosis Date Noted  . Gastroesophageal reflux disease 12/12/2018  . Malignant neoplasm of left breast in female, estrogen receptor positive ([place]) 12/12/2018  . Hyperlipidemia 12/12/2018  . Essential hypertension 12/12/2018  . Current smoker 12/12/2018  . Vitamin B12 deficiency 12/12/2018  . Major depressive disorder with current active episode 12/12/2018  . Aortic valve stenosis 12/12/2018  . History of esophageal dilatation 12/12/2018  . TIA on medication 12/12/2018    Past Surgical History:  Procedure Laterality Date  . BREAST BIOPSY Left 12/16/2016   [place] bx of mass at 12:00  . BREAST BIOPSY Left ? DATE   BENIGN, MARKER IN BREAST  . BREAST BIOPSY Right YRS AGO   BENIGN  . BREAST LUMPECTOMY Left 12/30/2016    left breast ca at 12:00 and LN excisied  . CATARACT EXTRACTION    . collapsed lung     . CYST EXCISION      Prior to Admission medications   Medication Sig Start Date End Date Taking? Authorizing Provider  amoxicillin-clavulanate (AUGMENTIN) 875-125 MG tablet Take 1 tablet by mouth 2 (two) times daily for 7 days. 09/25/19 10/02/19  Lavonia Drafts, MD  atorvastatin (LIPITOR) 40 MG tablet Take 1 tablet (40 mg total) by mouth daily. 06/07/19   Delsa Grana, PA-C  Calcium Carbonate-Vitamin D 600-200 MG-UNIT TABS Take 1 tablet by mouth daily.    [provider]  cholecalciferol (VITAMIN D3) 25 MCG (1000 UT) tablet Take 2,000 Units by mouth daily.    [provider]  clopidogrel (PLAVIX) 75 MG tablet Take 1 tablet (75 mg total) by mouth daily. 06/07/19   Delsa Grana, PA-C  fluconazole (DIFLUCAN) 150 MG tablet Take 1 tablet (150 mg total) by mouth daily. Can take additional tablet 72 hours after the first if needed 09/25/19   Lavonia Drafts, MD  letrozole [facility]) 2.5 MG tablet Take 1 tablet (2.5 mg total) by mouth daily. 06/10/19   Lloyd Huger, MD  Omega-3 Fatty Acids (FISH OIL) 1000 MG CAPS Take by mouth.    [provider]  omeprazole (PRILOSEC) 20 MG capsule Take 1 capsule (20 mg total) by mouth daily. 06/07/19   Delsa Grana, PA-C  valsartan-hydrochlorothiazide (DIOVAN-HCT) 80-12.5 MG tablet Take  1 tablet by mouth daily. 06/07/19   Delsa Grana, PA-C  venlafaxine XR (EFFEXOR XR) 37.5 MG 24 hr capsule Take 1 capsule (37.5 mg total) by mouth daily. 09/25/19 09/24/20  Lavonia Drafts, MD  vitamin B-12 (CYANOCOBALAMIN) 1000 MCG tablet Take 1,000 mcg by mouth daily.    [provider]     Allergies Patient has no known allergies.  Family History  Problem Relation Age of Onset  . Stroke Mother   . Arthritis Mother   . Heart failure Father   . Prostate cancer Brother   . Depression Daughter   . Heart murmur Son   . Breast cancer Daughter 61    Social  History Social History   Tobacco Use  . Smoking status: Current Every Day Smoker    Packs/day: 0.50    Types: Cigarettes  . Smokeless tobacco: Never Used  Vaping Use  . Vaping Use: Never used  Substance Use Topics  . Alcohol use: Yes    Alcohol/week: 7.0 standard drinks    Types: 7 Glasses of wine per week  . Drug use: Never    Review of Systems  Constitutional: As above Eyes: No visual changes.  ENT: No sore throat. Cardiovascular: Denies chest pain. Respiratory: Denies shortness of breath. Gastrointestinal: As above Genitourinary: Negative for dysuria. Musculoskeletal: Negative for back pain. Skin: Negative for rash. Neurological: Negative for headaches    ____________________________________________   PHYSICAL EXAM:  VITAL SIGNS: ED Triage Vitals [09/25/19 0633]  Enc Vitals Group     BP (!) 151/87     Pulse Rate 84     Resp 16     Temp 98.6 F (37 C)     Temp Source Oral     SpO2 98 %     Weight      Height      Head Circumference      Peak Flow      Pain Score 3     Pain Loc      Pain Edu?      Excl. in [place]?     Constitutional: Alert and oriented. No acute distress. Pleasant and interactive  Nose: No congestion/rhinnorhea. Mouth/Throat: Mucous membranes are moist.    Cardiovascular: Normal rate, regular rhythm. Grossly normal heart sounds.  Good peripheral circulation. Respiratory: Normal respiratory effort.  No retractions. Lungs CTAB. Gastrointestinal: Soft and nontender. No distention.  No CVA tenderness.  Musculoskeletal:   Warm and well perfused Neurologic:  Normal speech and language. No gross focal neurologic deficits are appreciated.  Skin:  Skin is warm, dry and intact. No rash noted. Psychiatric: Mood and affect are normal. Speech and behavior are normal.  Did become tearful while discussing her symptoms  ____________________________________________   LABS (all labs ordered are listed, but only abnormal results are  displayed)  Labs Reviewed  COMPREHENSIVE METABOLIC PANEL - Abnormal; Notable for the following components:      Result Value   Potassium 3.1 (*)    Glucose, Bld 102 (*)    All other components within normal limits  URINALYSIS, COMPLETE (UACMP) WITH MICROSCOPIC - Abnormal; Notable for the following components:   Color, Urine YELLOW (*)    APPearance CLEAR (*)    Hgb urine dipstick SMALL (*)    Leukocytes,Ua SMALL (*)    Non Squamous Epithelial PRESENT (*)    All other components within normal limits  LIPASE, BLOOD  CBC   ____________________________________________  EKG  None ____________________________________________  RADIOLOGY   ____________________________________________   PROCEDURES  Procedure(s) performed: No  Procedures   Critical Care performed: No ____________________________________________   INITIAL IMPRESSION / ASSESSMENT AND PLAN / ED COURSE  Pertinent labs & imaging results that were available during my care of the patient were reviewed by me and considered in my medical decision making (see chart for details).  Patient well-appearing and in no acute distress.  Lab work today is quite reassuring with normal white blood cell count.  No dysuria frequency or symptoms of urinary tract infection.  Very minimal tenderness to palpation the left lower quadrant.  Some concern for diverticulitis so we will prescribe antibiotics for this.  Additionally patient with complaints of low energy, tearfulness/mood lability suspicious for depression.  No SI or HI.  Will restart Effexor at 37.5 mg, she has outpatient follow-up with her PCP in 1 day.    ____________________________________________   FINAL CLINICAL IMPRESSION(S) / ED DIAGNOSES  Final diagnoses:  Left lower quadrant abdominal pain  Diarrhea, unspecified type        Note:  This document was prepared using Dragon voice recognition software and may include unintentional dictation errors.    Lavonia Drafts, MD 09/25/19 1745

## 2019-09-25 NOTE — ED Triage Notes (Signed)
2 weeks of feeling weak, 1 week of "yellowish" diarrhea (has subsided), and left lower abdominal pain.

## 2019-09-25 NOTE — ED Notes (Signed)
EDP, K[place] to bedside.

## 2019-09-25 NOTE — ED Notes (Signed)
Pt updated on WR, VS reassessed

## 2019-09-26 ENCOUNTER — Ambulatory Visit (INDEPENDENT_AMBULATORY_CARE_PROVIDER_SITE_OTHER): Payer: Medicare Other

## 2019-09-26 DIAGNOSIS — Z0000 Encounter for general adult medical examination without abnormal findings: Secondary | ICD-10-CM | POA: Diagnosis not present

## 2019-09-26 NOTE — Patient Instructions (Signed)
Heather Carpenter , Thank you for taking time to come for your Medicare Wellness Visit. I appreciate your ongoing commitment to your health goals. Please review the following plan we discussed and let me know if I can assist you in the future.   Screening recommendations/referrals: Colonoscopy: no longer required Mammogram: done 01/07/19 Bone Density: scheduled for 11/19/19 Recommended yearly ophthalmology/optometry visit for glaucoma screening and checkup Recommended yearly dental visit for hygiene and checkup  Vaccinations: Influenza vaccine: postponed Pneumococcal vaccine: postponed Tdap vaccine: due Shingles vaccine: Shingrix discussed. Please contact your pharmacy for coverage information.  Covid-19: done 03/27/19 7 04/17/19  Conditions/risks identified: Recommend increasing physical activity as tolerated  Next appointment: Follow up in one year for your annual wellness visit    Preventive Care 65 Years and Older, Female Preventive care refers to lifestyle choices and visits with your health care provider that can promote health and wellness. What does preventive care include?  A yearly physical exam. This is also called an annual well check.  Dental exams once or twice a year.  Routine eye exams. Ask your health care provider how often you should have your eyes checked.  Personal lifestyle choices, including:  Daily care of your teeth and gums.  Regular physical activity.  Eating a healthy diet.  Avoiding tobacco and drug use.  Limiting alcohol use.  Practicing safe sex.  Taking low-dose aspirin every day.  Taking vitamin and mineral supplements as recommended by your health care provider. What happens during an annual well check? The services and screenings done by your health care provider during your annual well check will depend on your age, overall health, lifestyle risk factors, and family history of disease. Counseling  Your health care provider may ask you  questions about your:  Alcohol use.  Tobacco use.  Drug use.  Emotional well-being.  Home and relationship well-being.  Sexual activity.  Eating habits.  History of falls.  Memory and ability to understand (cognition).  Work and work Statistician.  Reproductive health. Screening  You may have the following tests or measurements:  Height, weight, and BMI.  Blood pressure.  Lipid and cholesterol levels. These may be checked every 5 years, or more frequently if you are over 70 years old.  Skin check.  Lung cancer screening. You may have this screening every year starting at age 95 if you have a 30-pack-year history of smoking and currently smoke or have quit within the past 15 years.  Fecal occult blood test (FOBT) of the stool. You may have this test every year starting at age 55.  Flexible sigmoidoscopy or colonoscopy. You may have a sigmoidoscopy every 5 years or a colonoscopy every 10 years starting at age 18.  Hepatitis C blood test.  Hepatitis B blood test.  Sexually transmitted disease (STD) testing.  Diabetes screening. This is done by checking your blood sugar (glucose) after you have not eaten for a while (fasting). You may have this done every 1-3 years.  Bone density scan. This is done to screen for osteoporosis. You may have this done starting at age 49.  Mammogram. This may be done every 1-2 years. Talk to your health care provider about how often you should have regular mammograms. Talk with your health care provider about your test results, treatment options, and if necessary, the need for more tests. Vaccines  Your health care provider may recommend certain vaccines, such as:  Influenza vaccine. This is recommended every year.  Tetanus, diphtheria, and acellular pertussis (Tdap,  Td) vaccine. You may need a Td booster every 10 years.  Zoster vaccine. You may need this after age 41.  Pneumococcal 13-valent conjugate (PCV13) vaccine. One dose is  recommended after age 96.  Pneumococcal polysaccharide (PPSV23) vaccine. One dose is recommended after age 6. Talk to your health care provider about which screenings and vaccines you need and how often you need them. This information is not intended to replace advice given to you by your health care provider. Make sure you discuss any questions you have with your health care provider. Document Released: 04/03/2015 Document Revised: 11/25/2015 Document Reviewed: 01/06/2015 Elsevier Interactive Patient Education  2017 [place] Prevention in the Home Falls can cause injuries. They can happen to people of all ages. There are many things you can do to make your home safe and to help prevent falls. What can I do on the outside of my home?  Regularly fix the edges of walkways and driveways and fix any cracks.  Remove anything that might make you trip as you walk through a door, such as a raised step or threshold.  Trim any bushes or trees on the path to your home.  Use bright outdoor lighting.  Clear any walking paths of anything that might make someone trip, such as rocks or tools.  Regularly check to see if handrails are loose or broken. Make sure that both sides of any steps have handrails.  Any raised decks and porches should have guardrails on the edges.  Have any leaves, snow, or ice cleared regularly.  Use sand or salt on walking paths during winter.  Clean up any spills in your garage right away. This includes oil or grease spills. What can I do in the bathroom?  Use night lights.  Install grab bars by the toilet and in the tub and shower. Do not use towel bars as grab bars.  Use non-skid mats or decals in the tub or shower.  If you need to sit down in the shower, use a plastic, non-slip stool.  Keep the floor dry. Clean up any water that spills on the floor as soon as it happens.  Remove soap buildup in the tub or shower regularly.  Attach bath mats  securely with double-sided non-slip rug tape.  Do not have throw rugs and other things on the floor that can make you trip. What can I do in the bedroom?  Use night lights.  Make sure that you have a light by your bed that is easy to reach.  Do not use any sheets or blankets that are too big for your bed. They should not hang down onto the floor.  Have a firm chair that has side arms. You can use this for support while you get dressed.  Do not have throw rugs and other things on the floor that can make you trip. What can I do in the kitchen?  Clean up any spills right away.  Avoid walking on wet floors.  Keep items that you use a lot in easy-to-reach places.  If you need to reach something above you, use a strong step stool that has a grab bar.  Keep electrical cords out of the way.  Do not use floor polish or wax that makes floors slippery. If you must use wax, use non-skid floor wax.  Do not have throw rugs and other things on the floor that can make you trip. What can I do with my stairs?  Do not  leave any items on the stairs.  Make sure that there are handrails on both sides of the stairs and use them. Fix handrails that are broken or loose. Make sure that handrails are as long as the stairways.  Check any carpeting to make sure that it is firmly attached to the stairs. Fix any carpet that is loose or worn.  Avoid having throw rugs at the top or bottom of the stairs. If you do have throw rugs, attach them to the floor with carpet tape.  Make sure that you have a light switch at the top of the stairs and the bottom of the stairs. If you do not have them, ask someone to add them for you. What else can I do to help prevent falls?  Wear shoes that:  Do not have high heels.  Have rubber bottoms.  Are comfortable and fit you well.  Are closed at the toe. Do not wear sandals.  If you use a stepladder:  Make sure that it is fully opened. Do not climb a closed  stepladder.  Make sure that both sides of the stepladder are locked into place.  Ask someone to hold it for you, if possible.  Clearly mark and make sure that you can see:  Any grab bars or handrails.  First and last steps.  Where the edge of each step is.  Use tools that help you move around (mobility aids) if they are needed. These include:  Canes.  Walkers.  Scooters.  Crutches.  Turn on the lights when you go into a dark area. Replace any light bulbs as soon as they burn out.  Set up your furniture so you have a clear path. Avoid moving your furniture around.  If any of your floors are uneven, fix them.  If there are any pets around you, be aware of where they are.  Review your medicines with your doctor. Some medicines can make you feel dizzy. This can increase your chance of falling. Ask your doctor what other things that you can do to help prevent falls. This information is not intended to replace advice given to you by your health care provider. Make sure you discuss any questions you have with your health care provider. Document Released: 01/01/2009 Document Revised: 08/13/2015 Document Reviewed: 04/11/2014 Elsevier Interactive Patient Education  2017 [employer].

## 2019-09-26 NOTE — Progress Notes (Signed)
Subjective:   Heather Carpenter is a 79 y.o. female who presents for Medicare Annual (Subsequent) preventive examination.  Virtual Visit via Telephone Note  I connected with  Jaclynn Major on 09/26/19 at 10:00 AM EDT by telephone and verified that I am speaking with the correct person using two identifiers.  Medicare Annual Wellness visit completed telephonically due to Covid-19 pandemic.   Location: Patient: home Provider: office   I discussed the limitations, risks, security and privacy concerns of performing an evaluation and management service by telephone and the availability of in person appointments. The patient expressed understanding and agreed to proceed.  Unable to perform video visit due to video visit attempted and failed and/or patient does not have video capability.   Some vital signs may be absent or patient reported.   Clemetine Marker, LPN    Review of Systems     Cardiac Risk Factors include: advanced age (>62men, >33 women);dyslipidemia;hypertension     Objective:    Today's Vitals   09/26/19 1027  PainSc: 3    There is no height or weight on file to calculate BMI.  Advanced Directives 09/25/2019 06/10/2019 05/26/2019  Does Patient Have a Medical Advance Directive? No No Yes  Type of Advance Directive - [place];Living will [place];Living will  Copy of [place] in Chart? - No - copy requested No - copy requested  Would patient like information on creating a medical advance directive? No - Patient declined - -    Current Medications (verified) Outpatient Encounter Medications as of 09/26/2019  Medication Sig  . amoxicillin-clavulanate (AUGMENTIN) 875-125 MG tablet Take 1 tablet by mouth 2 (two) times daily for 7 days.  Marland Kitchen atorvastatin (LIPITOR) 40 MG tablet Take 1 tablet (40 mg total) by mouth daily.  . Calcium Carbonate-Vitamin D 600-200 MG-UNIT TABS Take 1 tablet by mouth daily.  .  cholecalciferol (VITAMIN D3) 25 MCG (1000 UT) tablet Take 2,000 Units by mouth daily.  . clopidogrel (PLAVIX) 75 MG tablet Take 1 tablet (75 mg total) by mouth daily.  Marland Kitchen letrozole (FEMARA) 2.5 MG tablet Take 1 tablet (2.5 mg total) by mouth daily.  Marland Kitchen omeprazole (PRILOSEC) 20 MG capsule Take 1 capsule (20 mg total) by mouth daily.  . valsartan-hydrochlorothiazide (DIOVAN-HCT) 80-12.5 MG tablet Take 1 tablet by mouth daily.  Marland Kitchen venlafaxine XR (EFFEXOR XR) 37.5 MG 24 hr capsule Take 1 capsule (37.5 mg total) by mouth daily.  . vitamin B-12 (CYANOCOBALAMIN) 1000 MCG tablet Take 1,000 mcg by mouth daily.  . fluconazole (DIFLUCAN) 150 MG tablet Take 1 tablet (150 mg total) by mouth daily. Can take additional tablet 72 hours after the first if needed  . [DISCONTINUED] Omega-3 Fatty Acids (FISH OIL) 1000 MG CAPS Take by mouth.   No facility-administered encounter medications on file as of 09/26/2019.    Allergies (verified) Patient has no known allergies.   History: Past Medical History:  Diagnosis Date  . Anemia   . Arthritis   . Breast cancer ([place]) 2018   left breast lumpectomy  . Depression   . Diverticulosis   . GERD (gastroesophageal reflux disease)   . Hx-TIA (transient ischemic attack)   . Hyperlipidemia   . Hypertension   . Moderate aortic stenosis by prior echocardiogram    Past Surgical History:  Procedure Laterality Date  . BREAST BIOPSY Left 12/16/2016   [place] bx of mass at 12:00  . BREAST BIOPSY Left ? DATE   BENIGN,  MARKER IN BREAST  . BREAST BIOPSY Right YRS AGO   BENIGN  . BREAST LUMPECTOMY Left 12/30/2016   left breast ca at 12:00 and LN excisied  . CATARACT EXTRACTION    . collapsed lung     . CYST EXCISION     Family History  Problem Relation Age of Onset  . Stroke Mother   . Arthritis Mother   . Heart failure Father   . Prostate cancer Brother   . Depression Daughter   . Heart murmur Son   . Breast cancer Daughter 97   Social History   Socioeconomic  History  . Marital status: Divorced    Spouse name: Not on file  . Number of children: 3  . Years of education: 69  . Highest education level: Not on file  Occupational History  . Occupation: retired  Tobacco Use  . Smoking status: Former Smoker    Packs/day: 0.50    Types: Cigarettes    Quit date: 08/20/2019    Years since quitting: 0.1  . Smokeless tobacco: Never Used  . Tobacco comment: used chantix to stop smoking  Vaping Use  . Vaping Use: Never used  Substance and Sexual Activity  . Alcohol use: Yes    Alcohol/week: 3.0 standard drinks    Types: 3 Glasses of wine per week  . Drug use: Never  . Sexual activity: Not Currently  Other Topics Concern  . Not on file  Social History Narrative  . Not on file   Social Determinants of Health   Financial Resource Strain: Low Risk   . Difficulty of Paying Living Expenses: Not hard at all  Food Insecurity: No Food Insecurity  . Worried About Charity fundraiser in the Last Year: Never true  . Ran Out of Food in the Last Year: Never true  Transportation Needs: No Transportation Needs  . Lack of Transportation (Medical): No  . Lack of Transportation (Non-Medical): No  Physical Activity: Inactive  . Days of Exercise per Week: 0 days  . Minutes of Exercise per Session: 0 min  Stress: Stress Concern Present  . Feeling of Stress : To some extent  Social Connections: Socially Isolated  . Frequency of Communication with Friends and Family: More than three times a week  . Frequency of Social Gatherings with Friends and Family: More than three times a week  . Attends Religious Services: Never  . Active Member of Clubs or Organizations: No  . Attends Archivist Meetings: Never  . Marital Status: Divorced    Tobacco Counseling Counseling given: Not Answered Comment: used chantix to stop smoking   Clinical Intake:  Pre-visit preparation completed: Yes  Pain : 0-10 Pain Score: 3  Pain Type: Acute pain Pain  Location: Abdomen Pain Descriptors / Indicators: Discomfort Pain Onset: In the past 7 days Pain Frequency: Intermittent     Nutritional Risks: Nausea/ vomitting/ diarrhea Diabetes: No  How often do you need to have someone help you when you read instructions, pamphlets, or other written materials from your doctor or pharmacy?: 1 - Never    Interpreter Needed?: No  Information entered by :: Clemetine Marker LPN   Activities of Daily Living In your present state of health, do you have any difficulty performing the following activities: 09/26/2019 06/07/2019  Hearing? N N  Comment declines hearing aids -  Vision? N N  Difficulty concentrating or making decisions? N N  Walking or climbing stairs? N N  Dressing or bathing? N  N  Doing errands, shopping? N N  Preparing Food and eating ? N -  Using the Toilet? N -  In the past six months, have you accidently leaked urine? N -  Do you have problems with loss of bowel control? N -  Managing your Medications? N -  Managing your Finances? N -  Housekeeping or managing your Housekeeping? N -  Some recent data might be hidden    Patient Care Team: Delsa Grana, PA-C as PCP - General (Family Medicine) Kate Sable, MD as PCP - Cardiology (Cardiology)  Indicate any recent Medical Services you may have received from other than [facility] providers in the past year (date may be approximate).     Assessment:   This is a routine wellness examination for [facility].  Hearing/Vision screen  Hearing Screening   125Hz  250Hz  500Hz  1000Hz  2000Hz  3000Hz  4000Hz  6000Hz  8000Hz   Right ear:           Left ear:           Comments: Pt denies hearing difficulty  Vision Screening Comments: [facility] screenings [facility]  Dietary issues and exercise activities discussed: Current Exercise Habits: The patient does not participate in regular exercise at present, Exercise limited by: None identified  Goals    . Patient Stated     Patient  states she would like to be more social and join the senior center and volunteer at a food bank.       Depression Screen PHQ 2/9 Scores 09/26/2019 09/20/2019 06/07/2019 12/07/2018  PHQ - 2 Score 2 0 1 0  PHQ- 9 Score 8 0 2 2    Fall Risk Fall Risk  09/26/2019 09/20/2019 06/07/2019 12/07/2018  Falls in the past year? 0 0 0 0  Number falls in past yr: 0 0 0 0  Injury with Fall? 0 0 0 0  Risk for fall due to : No Fall Risks - - -  Follow up Falls prevention discussed - - -    Any stairs in or around the home? Yes  If so, are there any without handrails? Yes  Home free of loose throw rugs in walkways, pet beds, electrical cords, etc? Yes  Adequate lighting in your home to reduce risk of falls? Yes   ASSISTIVE DEVICES UTILIZED TO PREVENT FALLS:  Life alert? No  Use of a cane, walker or w/c? No  Grab bars in the bathroom? Yes  Shower chair or bench in shower? No  Elevated toilet seat or a handicapped toilet? No   TIMED UP AND GO:  Was the test performed? No . Telephonic visit.   Cognitive Function: pt declined 6CIT; states no memory issues        Immunizations Immunization History  Administered Date(s) Administered  . PFIZER SARS-COV-2 Vaccination 03/27/2019, 04/17/2019  . Pneumococcal Conjugate-13 03/21/2017    TDAP status: Due, Education has been provided regarding the importance of this vaccine. Advised may receive this vaccine at local pharmacy or Health Dept. Aware to provide a copy of the vaccination record if obtained from local pharmacy or Health Dept. Verbalized acceptance and understanding.   Flu Vaccine status: Declined, Education has been provided regarding the importance of this vaccine but patient still declined. Advised may receive this vaccine at local pharmacy or Health Dept. Aware to provide a copy of the vaccination record if obtained from local pharmacy or Health Dept. Verbalized acceptance and understanding.   Pneumococcal vaccine status: Declined,  Education has  been  provided regarding the importance of this vaccine but patient still declined. Advised may receive this vaccine at local pharmacy or Health Dept. Aware to provide a copy of the vaccination record if obtained from local pharmacy or Health Dept. Verbalized acceptance and understanding.    Covid-19 vaccine status: Completed vaccines  Qualifies for Shingles Vaccine? Yes   Zostavax completed Yes   Shingrix Completed?: No.    Education has been provided regarding the importance of this vaccine. Patient has been advised to call insurance company to determine out of pocket expense if they have not yet received this vaccine. Advised may also receive vaccine at local pharmacy or Health Dept. Verbalized acceptance and understanding.  Screening Tests Health Maintenance  Topic Date Due  . Hepatitis C Screening  Never done  . PNA vac Low Risk Adult (2 of 2 - PPSV23) 03/21/2018  . TETANUS/TDAP  06/06/2020 (Originally 04/16/1959)  . INFLUENZA VACCINE  10/20/2019  . MAMMOGRAM  01/07/2020  . DEXA SCAN  Completed  . COVID-19 Vaccine  Completed    Health Maintenance  Health Maintenance Due  Topic Date Due  . Hepatitis C Screening  Never done  . PNA vac Low Risk Adult (2 of 2 - PPSV23) 03/21/2018    Colorectal cancer screening: No longer required.    Mammogram status: Completed 01/07/19. Repeat every year   Bone Density status: scheduled for 11/19/19.  Lung Cancer Screening: (Low Dose CT Chest recommended if Age 48-80 years, 30 pack-year currently smoking OR have quit w/in 15years.) does qualify. Pt declined.  Additional Screening:  Hepatitis C Screening: Does qualify; postponed  Vision Screening: Recommended annual ophthalmology exams for early detection of glaucoma and other disorders of the eye. Is the patient up to date with their annual eye exam?  Yes  Who is the provider or what is the name of the office in which the patient attends annual eye exams? [facility]  Screening: Recommended annual dental exams for proper oral hygiene  Community Resource Referral / Chronic Care Management: CRR required this visit?  No   CCM required this visit?  No      Plan:     I have personally reviewed and noted the following in the patient's chart:   . Medical and social history . Use of alcohol, tobacco or illicit drugs  . Current medications and supplements . Functional ability and status . Nutritional status . Physical activity . Advanced directives . List of other physicians . Hospitalizations, surgeries, and ER visits in previous 12 months . Vitals . Screenings to include cognitive, depression, and falls . Referrals and appointments  In addition, I have reviewed and discussed with patient certain preventive protocols, quality metrics, and best practice recommendations. A written personalized care plan for preventive services as well as general preventive health recommendations were provided to patient.     Clemetine Marker, LPN   10/22/3823   Nurse Notes: pt seen in ED yesterday for abdominal pain and diarrhea; pt given augmentin due to concern for diverticulitis and she contacted the ED for a possible replacement rx due to continued diarrhea. Pt feels week and tired but drinking pedialyte and resting.   Pt was also restarted on effexor 37.5 mg yesterday and will follow up with Delsa Grana PAC on 12/11/19 or earlier if needed.

## 2019-10-04 ENCOUNTER — Other Ambulatory Visit: Payer: Self-pay

## 2019-10-04 ENCOUNTER — Emergency Department
Admission: EM | Admit: 2019-10-04 | Discharge: 2019-10-05 | Disposition: A | Discharge status: Home / Self Care | Payer: Medicare Other | Attending: Emergency Medicine | Admitting: Emergency Medicine

## 2019-10-04 DIAGNOSIS — F10129 Alcohol abuse with intoxication, unspecified: Secondary | ICD-10-CM | POA: Insufficient documentation

## 2019-10-04 DIAGNOSIS — R112 Nausea with vomiting, unspecified: Secondary | ICD-10-CM | POA: Diagnosis present

## 2019-10-04 DIAGNOSIS — I10 Essential (primary) hypertension: Secondary | ICD-10-CM | POA: Diagnosis not present

## 2019-10-04 DIAGNOSIS — Z79899 Other long term (current) drug therapy: Secondary | ICD-10-CM | POA: Diagnosis not present

## 2019-10-04 DIAGNOSIS — Z87891 Personal history of nicotine dependence: Secondary | ICD-10-CM | POA: Diagnosis not present

## 2019-10-04 DIAGNOSIS — Z853 Personal history of malignant neoplasm of breast: Secondary | ICD-10-CM | POA: Insufficient documentation

## 2019-10-04 DIAGNOSIS — F10920 Alcohol use, unspecified with intoxication, uncomplicated: Secondary | ICD-10-CM

## 2019-10-04 LAB — CBC WITH DIFFERENTIAL/PLATELET
Abs Immature Granulocytes: 0.02 10*3/uL (ref 0.00–0.07)
Basophils Absolute: 0.1 10*3/uL (ref 0.0–0.1)
Basophils Relative: 1 %
Eosinophils Absolute: 0.1 10*3/uL (ref 0.0–0.5)
Eosinophils Relative: 1 %
HCT: 34.1 % — ABNORMAL LOW (ref 36.0–46.0)
Hemoglobin: 11.6 g/dL — ABNORMAL LOW (ref 12.0–15.0)
Immature Granulocytes: 0 %
Lymphocytes Relative: 26 %
Lymphs Abs: 1.5 10*3/uL (ref 0.7–4.0)
MCH: 31.7 pg (ref 26.0–34.0)
MCHC: 34 g/dL (ref 30.0–36.0)
MCV: 93.2 fL (ref 80.0–100.0)
Monocytes Absolute: 0.5 10*3/uL (ref 0.1–1.0)
Monocytes Relative: 9 %
Neutro Abs: 3.7 10*3/uL (ref 1.7–7.7)
Neutrophils Relative %: 63 %
Platelets: 281 10*3/uL (ref 150–400)
RBC: 3.66 MIL/uL — ABNORMAL LOW (ref 3.87–5.11)
RDW: 12 % (ref 11.5–15.5)
WBC: 5.9 10*3/uL (ref 4.0–10.5)
nRBC: 0 % (ref 0.0–0.2)

## 2019-10-04 LAB — COMPREHENSIVE METABOLIC PANEL
ALT: 16 U/L (ref 0–44)
AST: 16 U/L (ref 15–41)
Albumin: 3.6 g/dL (ref 3.5–5.0)
Alkaline Phosphatase: 52 U/L (ref 38–126)
Anion gap: 10 (ref 5–15)
BUN: 13 mg/dL (ref 8–23)
CO2: 22 mmol/L (ref 22–32)
Calcium: 8.9 mg/dL (ref 8.9–10.3)
Chloride: 104 mmol/L (ref 98–111)
Creatinine, Ser: 0.63 mg/dL (ref 0.44–1.00)
GFR calc Af Amer: >60 mL/min (ref >60)
GFR calc non Af Amer: >60 mL/min (ref >60)
Glucose, Bld: 105 mg/dL — ABNORMAL HIGH (ref 70–99)
Potassium: 3.3 mmol/L — ABNORMAL LOW (ref 3.5–5.1)
Sodium: 136 mmol/L (ref 135–145)
Total Bilirubin: 0.5 mg/dL (ref 0.3–1.2)
Total Protein: 6.4 g/dL — ABNORMAL LOW (ref 6.5–8.1)

## 2019-10-04 LAB — LIPASE, BLOOD: Lipase: 45 U/L (ref 11–51)

## 2019-10-04 LAB — ETHANOL: Alcohol, Ethyl (B): 142 mg/dL — ABNORMAL HIGH (ref <10)

## 2019-10-04 MED ORDER — SODIUM CHLORIDE 0.9 % IV BOLUS
500.0000 mL | Freq: Once | INTRAVENOUS | Status: AC
  Administered 2019-10-04: 500 mL via INTRAVENOUS

## 2019-10-04 MED ORDER — ONDANSETRON HCL 4 MG/2ML IJ SOLN
4.0000 mg | Freq: Once | INTRAMUSCULAR | Status: AC
  Administered 2019-10-04: 4 mg via INTRAVENOUS
  Filled 2019-10-04: qty 2

## 2019-10-04 NOTE — ED Provider Notes (Signed)
[facility] Emergency Department Provider Note    First MD Initiated Contact with Patient 10/04/19 2127     (approximate)  I have reviewed the triage vital signs and the nursing notes.   HISTORY  Chief Complaint Alcohol Intoxication    HPI Heather Carpenter is a 79 y.o. female with recent diagnosis of diverticulitis presents to the ER for nausea and vomiting after she drank a bottle of wine to celebrate getting over her diverticulitis.  She denies any pain just feeling nauseated and sick.  States it was "girls night "and they typically drink wine and celebrate.  She drank more than typical.  Denies any other co-ingestion.    Past Medical History:  Diagnosis Date  . Anemia   . Arthritis   . Breast cancer ([place]) 2018   left breast lumpectomy  . Depression   . Diverticulosis   . GERD (gastroesophageal reflux disease)   . Hx-TIA (transient ischemic attack)   . Hyperlipidemia   . Hypertension   . Moderate aortic stenosis by prior echocardiogram    Family History  Problem Relation Age of Onset  . Stroke Mother   . Arthritis Mother   . Heart failure Father   . Prostate cancer Brother   . Depression Daughter   . Heart murmur Son   . Breast cancer Daughter 73   Past Surgical History:  Procedure Laterality Date  . BREAST BIOPSY Left 12/16/2016   [place] bx of mass at 12:00  . BREAST BIOPSY Left ? DATE   BENIGN, MARKER IN BREAST  . BREAST BIOPSY Right YRS AGO   BENIGN  . BREAST LUMPECTOMY Left 12/30/2016   left breast ca at 12:00 and LN excisied  . CATARACT EXTRACTION    . collapsed lung     . CYST EXCISION     Patient Active Problem List   Diagnosis Date Noted  . Gastroesophageal reflux disease 12/12/2018  . Malignant neoplasm of left breast in female, estrogen receptor positive ([place]) 12/12/2018  . Hyperlipidemia 12/12/2018  . Essential hypertension 12/12/2018  . Current smoker 12/12/2018  . Vitamin B12 deficiency 12/12/2018  . Major  depressive disorder with current active episode 12/12/2018  . Aortic valve stenosis 12/12/2018  . History of esophageal dilatation 12/12/2018  . TIA on medication 12/12/2018      Prior to Admission medications   Medication Sig Start Date End Date Taking? Authorizing Provider  atorvastatin (LIPITOR) 40 MG tablet Take 1 tablet (40 mg total) by mouth daily. 06/07/19   Delsa Grana, PA-C  Calcium Carbonate-Vitamin D 600-200 MG-UNIT TABS Take 1 tablet by mouth daily.    [provider]  cholecalciferol (VITAMIN D3) 25 MCG (1000 UT) tablet Take 2,000 Units by mouth daily.    [provider]  clopidogrel (PLAVIX) 75 MG tablet Take 1 tablet (75 mg total) by mouth daily. 06/07/19   Delsa Grana, PA-C  fluconazole (DIFLUCAN) 150 MG tablet Take 1 tablet (150 mg total) by mouth daily. Can take additional tablet 72 hours after the first if needed 09/25/19   Lavonia Drafts, MD  letrozole [facility]) 2.5 MG tablet Take 1 tablet (2.5 mg total) by mouth daily. 06/10/19   Lloyd Huger, MD  omeprazole (PRILOSEC) 20 MG capsule Take 1 capsule (20 mg total) by mouth daily. 06/07/19   Delsa Grana, PA-C  valsartan-hydrochlorothiazide (DIOVAN-HCT) 80-12.5 MG tablet Take 1 tablet by mouth daily. 06/07/19   Delsa Grana, PA-C  venlafaxine XR (EFFEXOR XR) 37.5 MG 24 hr capsule  Take 1 capsule (37.5 mg total) by mouth daily. 09/25/19 09/24/20  Lavonia Drafts, MD  vitamin B-12 (CYANOCOBALAMIN) 1000 MCG tablet Take 1,000 mcg by mouth daily.    [provider]    Allergies Patient has no known allergies.    Social History Social History   Tobacco Use  . Smoking status: Former Smoker    Packs/day: 0.50    Types: Cigarettes    Quit date: 08/20/2019    Years since quitting: 0.1  . Smokeless tobacco: Never Used  . Tobacco comment: used chantix to stop smoking  Vaping Use  . Vaping Use: Never used  Substance Use Topics  . Alcohol use: Yes    Alcohol/week: 3.0 standard drinks    Types: 3  Glasses of wine per week  . Drug use: Never    Review of Systems Patient denies headaches, rhinorrhea, blurry vision, numbness, shortness of breath, chest pain, edema, cough, abdominal pain, nausea, vomiting, diarrhea, dysuria, fevers, rashes or hallucinations unless otherwise stated above in HPI. ____________________________________________   PHYSICAL EXAM:  VITAL SIGNS: Vitals:   10/04/19 2121 10/04/19 2130  BP: 111/87 (!) 106/45  Pulse: 76 74  Resp: 18   Temp: 97.9 F (36.6 C)   SpO2: 96% 93%    Constitutional: Alert and oriented.  Eyes: Conjunctivae are normal.  Head: Atraumatic. Nose: No congestion/rhinnorhea. Mouth/Throat: Mucous membranes are moist.   Neck: No stridor. Painless ROM.  Cardiovascular: Normal rate, regular rhythm. Grossly normal heart sounds.  Good peripheral circulation. Respiratory: Normal respiratory effort.  No retractions. Lungs CTAB. Gastrointestinal: Soft and nontender. No distention. No abdominal bruits. No CVA tenderness. Genitourinary:  Musculoskeletal: No lower extremity tenderness nor edema.  No joint effusions. Neurologic:  Normal speech and language. No gross focal neurologic deficits are appreciated. No facial droop Skin:  Skin is warm, dry and intact. No rash noted. Psychiatric: Mood and affect are normal. Speech and behavior are normal.  ____________________________________________   LABS (all labs ordered are listed, but only abnormal results are displayed)  Results for orders placed or performed during the hospital encounter of 10/04/19 (from the past 24 hour(s))  CBC with Differential/Platelet     Status: Abnormal   Collection Time: 10/04/19  9:50 PM  Result Value Ref Range   WBC 5.9 4.0 - 10.5 K/uL   RBC 3.66 (L) 3.87 - 5.11 MIL/uL   Hemoglobin 11.6 (L) 12.0 - 15.0 g/dL   HCT 34.1 (L) 36 - 46 %   MCV 93.2 80.0 - 100.0 fL   MCH 31.7 26.0 - 34.0 pg   MCHC 34.0 30.0 - 36.0 g/dL   RDW 12.0 11.5 - 15.5 %   Platelets 281 150 -  400 K/uL   nRBC 0.0 0.0 - 0.2 %   Neutrophils Relative % 63 %   Neutro Abs 3.7 1.7 - 7.7 K/uL   Lymphocytes Relative 26 %   Lymphs Abs 1.5 0.7 - 4.0 K/uL   Monocytes Relative 9 %   Monocytes Absolute 0.5 0 - 1 K/uL   Eosinophils Relative 1 %   Eosinophils Absolute 0.1 0 - 0 K/uL   Basophils Relative 1 %   Basophils Absolute 0.1 0 - 0 K/uL   Immature Granulocytes 0 %   Abs Immature Granulocytes 0.02 0.00 - 0.07 K/uL  Comprehensive metabolic panel     Status: Abnormal   Collection Time: 10/04/19  9:50 PM  Result Value Ref Range   Sodium 136 135 - 145 mmol/L   Potassium 3.3 (L) 3.5 -  5.1 mmol/L   Chloride 104 98 - 111 mmol/L   CO2 22 22 - 32 mmol/L   Glucose, Bld 105 (H) 70 - 99 mg/dL   BUN 13 8 - 23 mg/dL   Creatinine, Ser 0.63 0.44 - 1.00 mg/dL   Calcium 8.9 8.9 - 10.3 mg/dL   Total Protein 6.4 (L) 6.5 - 8.1 g/dL   Albumin 3.6 3.5 - 5.0 g/dL   AST 16 15 - 41 U/L   ALT 16 0 - 44 U/L   Alkaline Phosphatase 52 38 - 126 U/L   Total Bilirubin 0.5 0.3 - 1.2 mg/dL   GFR calc non Af Amer >60 >60 mL/min   GFR calc Af Amer >60 >60 mL/min   Anion gap 10 5 - 15  Ethanol     Status: Abnormal   Collection Time: 10/04/19  9:50 PM  Result Value Ref Range   Alcohol, Ethyl (B) 142 (H) <10 mg/dL   ____________________________________________ ____________________________________________  RADIOLOGY   ____________________________________________   PROCEDURES  Procedure(s) performed:  Procedures    Critical Care performed: no ____________________________________________   INITIAL IMPRESSION / ASSESSMENT AND PLAN / ED COURSE  Pertinent labs & imaging results that were available during my care of the patient were reviewed by me and considered in my medical decision making (see chart for details).   DDX: intoxication, dehydration, electrolyte abn  Jeneva Schweizer is a 79 y.o. who presents to the ED with nausea, malaise after drinking a bottle of wine today.  Her abdominal exam  is soft and benign.  Order blood work.  Will order IV antiemetic and observe.  Clinical Course as of Oct 04 2243  Fri Oct 04, 2019  2229 Alcohol level consistent with her presentation.  No leukocytosis.  We will continue with IV hydration and reassessment.  Patient be signed out to oncoming physician follow-up fluids and reassess ensure patient tolerating p.o.   [PR]    Clinical Course User Index [PR] Merlyn Lot, MD    The patient was evaluated in Emergency Department today for the symptoms described in the history of present illness. He/she was evaluated in the context of the global COVID-19 pandemic, which necessitated consideration that the patient might be at risk for infection with the SARS-CoV-2 virus that causes COVID-19. Institutional protocols and algorithms that pertain to the evaluation of patients at risk for COVID-19 are in a state of rapid change based on information released by regulatory bodies including the CDC and federal and state organizations. These policies and algorithms were followed during the patient's care in the ED.  As part of my medical decision making, I reviewed the following data within the [place] notes reviewed and incorporated, Labs reviewed, notes from prior ED visits and [place] Controlled Substance Database   ____________________________________________   FINAL CLINICAL IMPRESSION(S) / ED DIAGNOSES  Final diagnoses:  Alcoholic intoxication without complication ([place])      NEW MEDICATIONS STARTED DURING THIS VISIT:  New Prescriptions   No medications on file     Note:  This document was prepared using Dragon voice recognition software and may include unintentional dictation errors.    Merlyn Lot, MD 10/04/19 2245

## 2019-10-04 NOTE — ED Provider Notes (Signed)
-----------------------------------------   11:17 PM on 10/04/2019 -----------------------------------------  Blood pressure (!) 106/45, pulse 74, temperature 97.9 F (36.6 C), resp. rate 18, height 5\' 5"  (1.651 m), weight 65.8 kg, SpO2 93 %.  Assuming care from Dr. Quentin Cornwall.  In short, Heather Carpenter is a 79 y.o. female with a chief complaint of Alcohol Intoxication .  Refer to the original H&P for additional details.  The current plan of care is to observe until clinically sober.  ----------------------------------------- 1:48 AM on 10/05/2019 -----------------------------------------  Patient is now clinically sober and ambulatory without difficulty.  She is appropriate for discharge home and was counseled to avoid drinking this much alcohol in the future.  Patient agrees with plan.    Blake Divine, MD 10/05/19 [phone]

## 2019-10-04 NOTE — ED Triage Notes (Signed)
Pt states coming in after getting drunk and vomiting. Pt states she had diverticulitis for the last several days with diarrhea and was feeling better and was celebrating

## 2019-10-05 DIAGNOSIS — F10129 Alcohol abuse with intoxication, unspecified: Secondary | ICD-10-CM | POA: Diagnosis not present

## 2019-10-05 NOTE — ED Notes (Signed)
Daughter called and stated someone will come and pick her up.

## 2019-10-05 NOTE — ED Notes (Signed)
Pt able to walk to bathroom with no assist. Pt now laying in bed.

## 2019-10-19 ENCOUNTER — Other Ambulatory Visit: Payer: Self-pay | Admitting: Family Medicine

## 2019-10-22 ENCOUNTER — Encounter: Payer: Self-pay | Admitting: Family Medicine

## 2019-10-22 ENCOUNTER — Ambulatory Visit (INDEPENDENT_AMBULATORY_CARE_PROVIDER_SITE_OTHER): Payer: Medicare Other | Admitting: Family Medicine

## 2019-10-22 ENCOUNTER — Other Ambulatory Visit: Payer: Self-pay

## 2019-10-22 VITALS — BP 112/76 | HR 81 | Temp 97.8°F | Resp 16 | Ht 65.0 in | Wt 149.1 lb

## 2019-10-22 DIAGNOSIS — Z09 Encounter for follow-up examination after completed treatment for conditions other than malignant neoplasm: Secondary | ICD-10-CM

## 2019-10-22 DIAGNOSIS — F325 Major depressive disorder, single episode, in full remission: Secondary | ICD-10-CM | POA: Diagnosis not present

## 2019-10-22 DIAGNOSIS — I10 Essential (primary) hypertension: Secondary | ICD-10-CM

## 2019-10-22 DIAGNOSIS — E7800 Pure hypercholesterolemia, unspecified: Secondary | ICD-10-CM

## 2019-10-22 DIAGNOSIS — K5792 Diverticulitis of intestine, part unspecified, without perforation or abscess without bleeding: Secondary | ICD-10-CM

## 2019-10-22 DIAGNOSIS — K219 Gastro-esophageal reflux disease without esophagitis: Secondary | ICD-10-CM | POA: Diagnosis not present

## 2019-10-22 MED ORDER — VENLAFAXINE HCL ER 37.5 MG PO CP24: 37.5000 mg | ORAL_CAPSULE | Freq: Every day | ORAL | 3 refills | Status: AC

## 2019-10-28 ENCOUNTER — Telehealth: Payer: Self-pay | Admitting: *Deleted

## 2019-10-28 NOTE — Telephone Encounter (Signed)
Pt called to cx her 12/12/19 appt. She stated that she was moving out of [place] and there was no need to R/S

## 2019-11-18 DIAGNOSIS — K5792 Diverticulitis of intestine, part unspecified, without perforation or abscess without bleeding: Secondary | ICD-10-CM | POA: Insufficient documentation

## 2019-11-18 DIAGNOSIS — F325 Major depressive disorder, single episode, in full remission: Secondary | ICD-10-CM | POA: Insufficient documentation

## 2019-11-18 NOTE — Progress Notes (Signed)
Name: Heather Carpenter   MRN: [id number]    DOB: Aug 16, 1940   Date:11/18/2019       Progress Note  Chief Complaint  Patient presents with  . Follow-up  . Hypertension  . Hyperlipidemia  . Gastroesophageal Reflux     Subjective:   Heather Carpenter is a 79 y.o. female, presents to clinic for follow-up after ER visit, also has questions about medication changes, also mentions weight loss following diverticulitis.  Pt presents for clafication on effexor low dose -  After divorce and moving to the area patient was feeling really good.  She did want to get off her medications, but notes that she has been on them for over 30 years.  She also did have a recent [place] at the beach with multiple branches of her family, after returning home started to make her feel sad thinking about her family members and she feels that she like to be back on a low-dose medication. Depression screen [facility] 2/9 10/22/2019 09/26/2019 09/20/2019 06/07/2019 12/07/2018  Decreased Interest 0 1 0 0 0  Down, Depressed, Hopeless 0 1 0 1 0  PHQ - 2 Score 0 2 0 1 0  Altered sleeping 0 2 0 0 0  Tired, decreased energy 0 2 0 1 1  Change in appetite 0 1 0 0 1  Feeling bad or failure about yourself  0 0 0 0 0  Trouble concentrating 0 0 0 0 0  Moving slowly or fidgety/restless 0 1 0 0 0  Suicidal thoughts 0 0 0 0 0  PHQ-9 Score 0 8 0 2 2  Difficult doing work/chores Not difficult at all Somewhat difficult Not difficult at all Not difficult at all Not difficult at all  She does feel that Effexor 37.5 mg daily is effective at this time, and she does feel better back on the medications  She recently went to the ER -about 2 to 3 weeks ago she presented to the ER after going out with girlfriends and drinking almost a bottle of wine.  She states that she normally does not drink that amount but they were celebrating.  She afterwards was somewhat drunk at home nauseous and felt ill and was brought to the ER.  Her blood pressure was soft but  in normal range, she had mild electrolyte abnormalities, blood work showed mild anemia, she was given antiemetics and a bolus of fluid and was able to go home.  Her blood alcohol was positive at 142, she had not eaten much prior to this get together due to recently recovering from diverticulitis.  Pt has lost some weight after diverticulitis - she was trying to loose some weight but then got sick.   She is eating less and drinking some protein supplements.   Overall she is only down about 4 pounds relative to about 5 months ago, she has gained a few pounds since her ER visit last month.       Wt Readings from Last 5 Encounters:  10/22/19 149 lb 1.6 oz (67.6 kg)  10/04/19 145 lb (65.8 kg)  06/10/19 161 lb 9.6 oz (73.3 kg)  06/07/19 157 lb 12.8 oz (71.6 kg)  05/26/19 153 lb (69.4 kg)      BMI Readings from Last 5 Encounters:  10/22/19 24.81 kg/m  10/04/19 24.13 kg/m  06/10/19 26.89 kg/m  06/07/19 26.26 kg/m  05/26/19 25.86 kg/m    She has history of TIA has continued to be compliant with Plavix, she is  not noted any bloody bowel movements, or melena no easy bruising.  She is also compliant with her statin medication  BP is lower today than what it has been in the past, she is managed with valsartan hydrochlorothiazide 80-12.5 mg daily BP Readings from Last 6 Encounters:  10/22/19 112/76  10/05/19 117/79  09/25/19 (!) 155/82  06/10/19 120/83  06/07/19 108/80  05/26/19 (!) 149/115  With conscious diet and weight loss efforts, patient's blood pressure does seem to be improved from prior elevated readings.  Patient denies any lightheadedness, syncopal episodes, dizziness, and at this time she does not want to change her medications.     Current Outpatient Medications:  .  atorvastatin (LIPITOR) 40 MG tablet, Take 1 tablet (40 mg total) by mouth daily., Disp: 90 tablet, Rfl: 3 .  Calcium Carbonate-Vitamin D 600-200 MG-UNIT TABS, Take 1 tablet by mouth daily., Disp: , Rfl:  .   cholecalciferol (VITAMIN D3) 25 MCG (1000 UT) tablet, Take 2,000 Units by mouth daily., Disp: , Rfl:  .  clopidogrel (PLAVIX) 75 MG tablet, Take 1 tablet (75 mg total) by mouth daily., Disp: 90 tablet, Rfl: 3 .  letrozole (FEMARA) 2.5 MG tablet, Take 1 tablet (2.5 mg total) by mouth daily., Disp: 90 tablet, Rfl: 3 .  omeprazole (PRILOSEC) 20 MG capsule, Take 1 capsule (20 mg total) by mouth daily., Disp: 90 capsule, Rfl: 3 .  valsartan-hydrochlorothiazide (DIOVAN-HCT) 80-12.5 MG tablet, Take 1 tablet by mouth daily., Disp: 90 tablet, Rfl: 3 .  venlafaxine XR (EFFEXOR XR) 37.5 MG 24 hr capsule, Take 1 capsule (37.5 mg total) by mouth daily., Disp: 90 capsule, Rfl: 3 .  vitamin B-12 (CYANOCOBALAMIN) 1000 MCG tablet, Take 1,000 mcg by mouth daily., Disp: , Rfl:  .  fluconazole (DIFLUCAN) 150 MG tablet, Take 1 tablet (150 mg total) by mouth daily. Can take additional tablet 72 hours after the first if needed (Patient not taking: Reported on 10/22/2019), Disp: 2 tablet, Rfl: 0  Patient Active Problem List   Diagnosis Date Noted  . Gastroesophageal reflux disease 12/12/2018  . Malignant neoplasm of left breast in female, estrogen receptor positive ([place]) 12/12/2018  . Hyperlipidemia 12/12/2018  . Essential hypertension 12/12/2018  . Current smoker 12/12/2018  . Vitamin B12 deficiency 12/12/2018  . MDD (major depressive disorder), recurrent episode, mild ([place]) 12/12/2018  . Aortic valve stenosis 12/12/2018  . History of esophageal dilatation 12/12/2018  . TIA on medication 12/12/2018    Past Surgical History:  Procedure Laterality Date  . BREAST BIOPSY Left 12/16/2016   [place] bx of mass at 12:00  . BREAST BIOPSY Left ? DATE   BENIGN, MARKER IN BREAST  . BREAST BIOPSY Right YRS AGO   BENIGN  . BREAST LUMPECTOMY Left 12/30/2016   left breast ca at 12:00 and LN excisied  . CATARACT EXTRACTION    . collapsed lung     . CYST EXCISION      Family History  Problem Relation Age of Onset  . Stroke  Mother   . Arthritis Mother   . Heart failure Father   . Prostate cancer Brother   . Depression Daughter   . Heart murmur Son   . Breast cancer Daughter 45    Social History   Tobacco Use  . Smoking status: Former Smoker    Packs/day: 0.50    Types: Cigarettes    Quit date: 08/20/2019    Years since quitting: 0.2  . Smokeless tobacco: Never Used  . Tobacco comment: used  chantix to stop smoking  Vaping Use  . Vaping Use: Never used  Substance Use Topics  . Alcohol use: Yes    Alcohol/week: 3.0 standard drinks    Types: 3 Glasses of wine per week  . Drug use: Never     No Known Allergies  Health Maintenance  Topic Date Due  . Hepatitis C Screening  Never done  . PNA vac Low Risk Adult (2 of 2 - PPSV23) 03/21/2018  . INFLUENZA VACCINE  10/20/2019  . TETANUS/TDAP  06/06/2020 (Originally 04/16/1959)  . MAMMOGRAM  01/07/2020  . DEXA SCAN  Completed  . COVID-19 Vaccine  Completed    Chart Review Today: I personally reviewed active problem list, medication list, allergies, family history, social history, health maintenance, notes from last encounter, lab results, imaging with the patient/caregiver today.   Review of Systems  10 Systems reviewed and are negative for acute change except as noted in the HPI.  Objective:   Vitals:   10/22/19 1012  BP: 112/76  Pulse: 81  Resp: 16  Temp: 97.8 F (36.6 C)  TempSrc: Temporal  SpO2: 96%  Weight: 149 lb 1.6 oz (67.6 kg)  Height: 5\' 5"  (1.651 m)    Body mass index is 24.81 kg/m.  Physical Exam Vitals and nursing note reviewed.  Constitutional:      General: She is not in acute distress.    Appearance: Normal appearance. She is well-developed and normal weight. She is not ill-appearing, toxic-appearing or diaphoretic.     Interventions: Face mask in place.  HENT:     Head: Normocephalic and atraumatic.     Right Ear: External ear normal.     Left Ear: External ear normal.     Nose: Nose normal.     Mouth/Throat:       Lips: Pink.     Mouth: Mucous membranes are moist.     Pharynx: Oropharynx is clear. Uvula midline.  Eyes:     General: Lids are normal. No scleral icterus.       Right eye: No discharge.        Left eye: No discharge.     Conjunctiva/sclera: Conjunctivae normal.  Neck:     Trachea: Phonation normal. No tracheal deviation.  Cardiovascular:     Rate and Rhythm: Normal rate and regular rhythm.     Pulses: Normal pulses.          Radial pulses are 2+ on the right side and 2+ on the left side.       Posterior tibial pulses are 2+ on the right side and 2+ on the left side.     Heart sounds: Normal heart sounds. No murmur heard.  No friction rub. No gallop.   Pulmonary:     Effort: Pulmonary effort is normal. No respiratory distress.     Breath sounds: Normal breath sounds. No stridor. No wheezing, rhonchi or rales.  Chest:     Chest wall: No tenderness.  Abdominal:     General: Bowel sounds are normal. There is no distension.     Palpations: Abdomen is soft.     Tenderness: There is no abdominal tenderness. There is no right CVA tenderness, left CVA tenderness or guarding.  Musculoskeletal:     Right lower leg: No edema.     Left lower leg: No edema.  Skin:    General: Skin is warm and dry.     Capillary Refill: Capillary refill takes less than 2 seconds.  Coloration: Skin is not jaundiced or pale.     Findings: No rash.  Neurological:     Mental Status: She is alert. Mental status is at baseline.     Motor: No weakness or abnormal muscle tone.     Coordination: Coordination normal.     Gait: Gait normal.  Psychiatric:        Mood and Affect: Mood normal.        Speech: Speech normal.        Behavior: Behavior normal.         Assessment & Plan:     ICD-10-CM   1. Major depressive disorder in full remission, unspecified whether recurrent (HCC)  F32.5 venlafaxine XR (EFFEXOR XR) 37.5 MG 24 hr capsule   she would like to restart effexor lowest dose possible, was  doing well off meds, but then started to have some sx/sadness, would like be restart  2. Pure hypercholesterolemia  E78.00    Patient continues to be compliant with atorvastatin daily, 40 mg no side effects or concerns at this time  3. Gastroesophageal reflux disease, unspecified whether esophagitis present  K21.9    She continues to take daily over-the-counter omeprazole 20 mg continues to deny dysphagia  4. Essential hypertension  I10    lower BP than previously with some weight loss and diet changes, pt does not want to change meds, we will continue to monitor  5. Encounter for examination following treatment at hospital  Z09    taken to the hospital after pt went out drinking with friends - she drank a little too much, rare for her, no past problems  6. Diverticulitis  K57.92    Improved over the last month will add to problem list, encouraged her to do an office visit with any signs of recurrence   Patient declined to do any follow-up labs today  Discussed with the patient drinking smaller volumes of alcohol, avoid binge drinking, avoid drinking on empty stomach.   [facility] Visit from 10/22/2019 in [facility]  AUDIT-C Score 4    Patient does not drink frequently, but has taken in larger amounts than what she usually does       Return in about 4 months (around 02/21/2020) for Routine follow-up HTN/HLD/effexor f/up.   Delsa Grana, PA-C 11/18/19 3:18 PM

## 2019-11-19 ENCOUNTER — Other Ambulatory Visit: Payer: Medicare Other

## 2019-12-11 ENCOUNTER — Ambulatory Visit: Payer: Medicare Other | Admitting: Family Medicine

## 2019-12-12 ENCOUNTER — Ambulatory Visit: Payer: Medicare Other | Admitting: Oncology

## 2020-02-21 ENCOUNTER — Ambulatory Visit: Payer: Medicare Other | Admitting: Family Medicine

## 2020-05-24 ENCOUNTER — Other Ambulatory Visit: Payer: Self-pay | Admitting: Oncology

## 2020-05-24 DIAGNOSIS — C50912 Malignant neoplasm of unspecified site of left female breast: Secondary | ICD-10-CM

## 2020-05-24 DIAGNOSIS — Z170 Estrogen receptor positive status [ER+]: Secondary | ICD-10-CM

## 2020-09-29 ENCOUNTER — Ambulatory Visit: Payer: Medicare Other

## 2021-07-19 ENCOUNTER — Telehealth: Payer: Self-pay | Admitting: Cardiology

## 2021-07-19 NOTE — Telephone Encounter (Signed)
Noted  

## 2021-07-19 NOTE — Telephone Encounter (Signed)
Have made several attempts to schedule follow up with no success ?Recall deleted ?

## 2022-01-07 IMAGING — CT CT ABD-PELV W/ CM
2 of 5 series · 16 of 46 positions shown, 18 images · IV contrast (APPLIED)
Comparison: No priors.

CLINICAL DATA: 79-year-old female with history of lower abdominal
pain and abdominal distension.

EXAM:
CT ABDOMEN AND PELVIS WITH CONTRAST
TECHNIQUE: Multidetector CT imaging of the abdomen and pelvis was performed
using the standard protocol following bolus administration of
intravenous contrast.
CONTRAST:  100mL OMNIPAQUE IOHEXOL 300 MG/ML  SOLN

[Series 2: routine abd/pel with · axial · 0.83mm/px · z∈[-480,-105]mm · 13 of 85 slices shown, 15 images]
[im 5/85  soft-tissue]
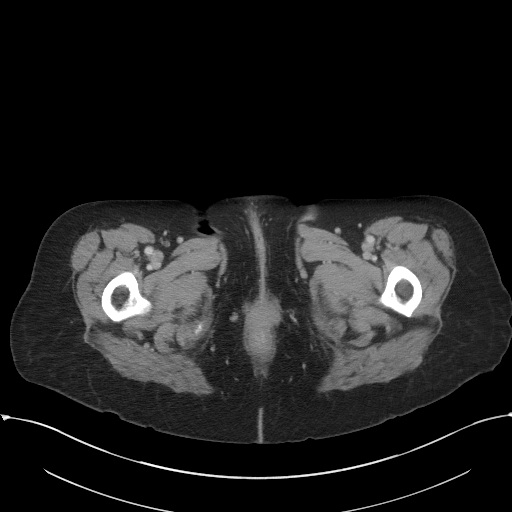
[im 5/85  bone]
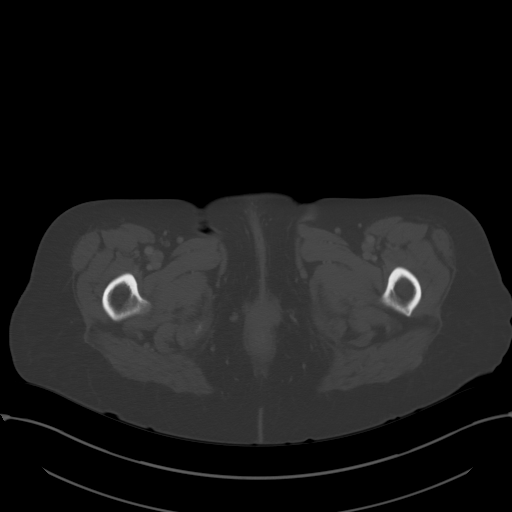
[im 14/85  soft-tissue]
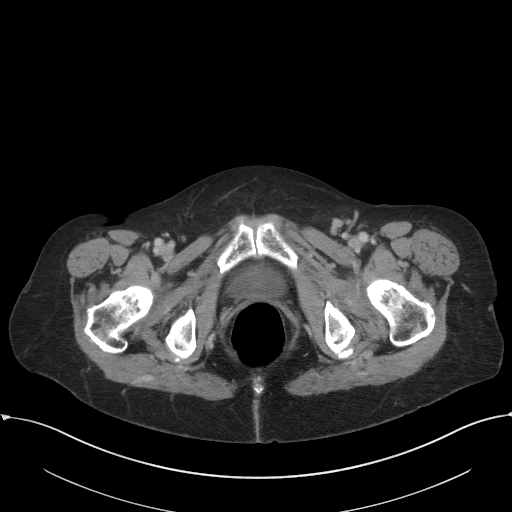
[im 18/85  soft-tissue]
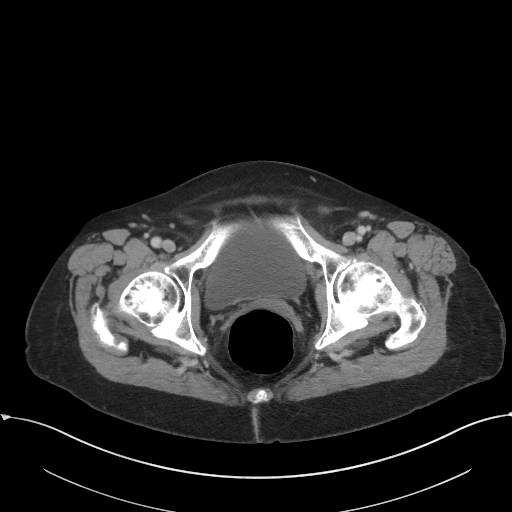
[im 23/85  soft-tissue]
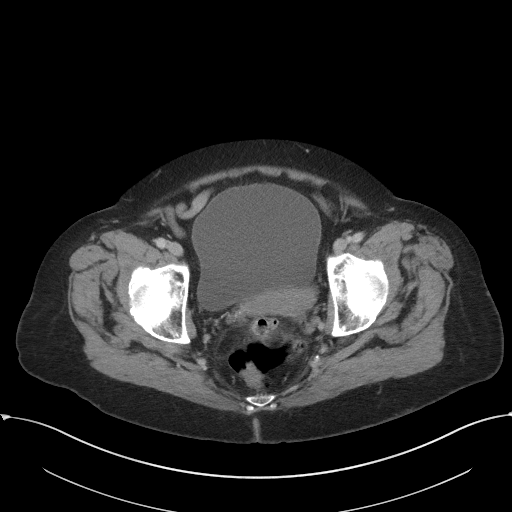
[im 31/85  soft-tissue]
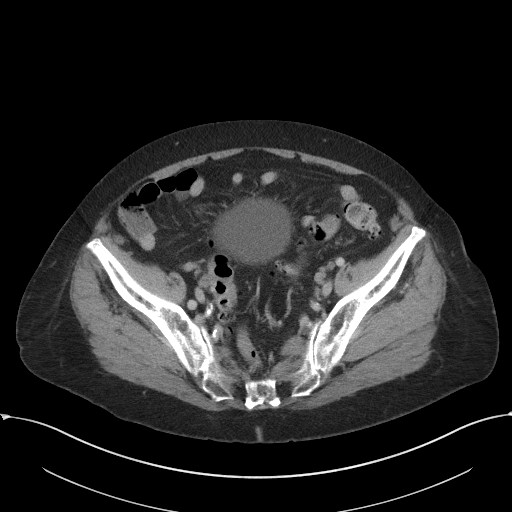
[im 36/85  soft-tissue]
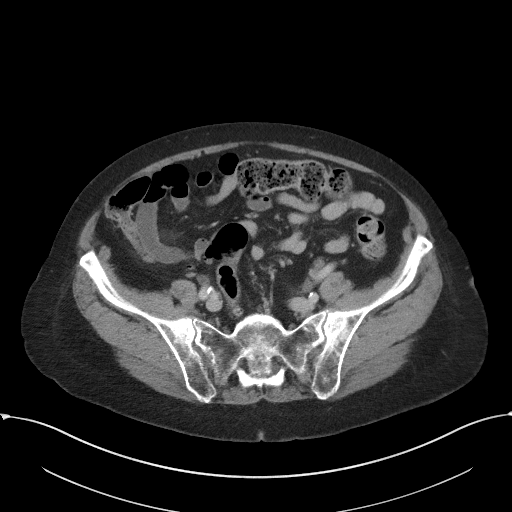
[im 45/85  soft-tissue]
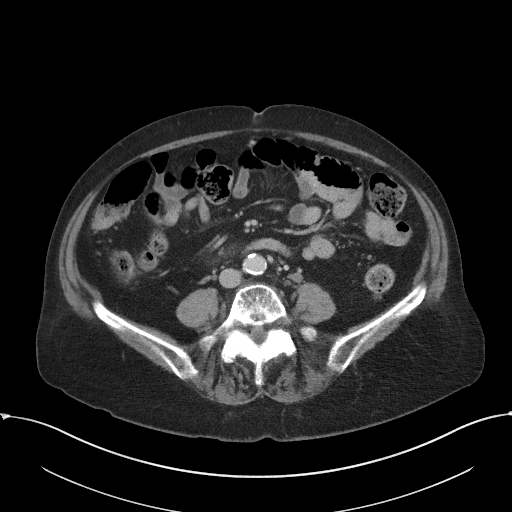
[im 49/85  soft-tissue]
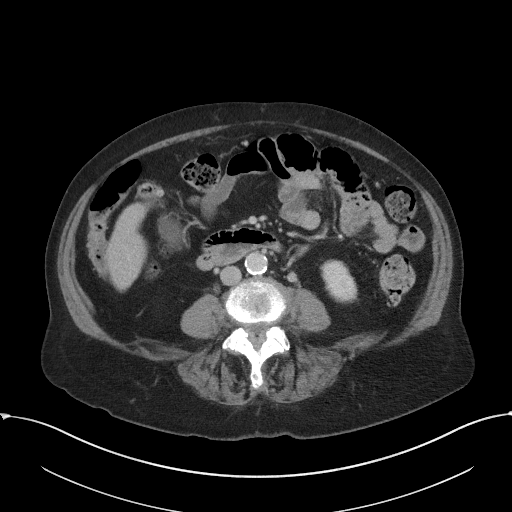
[im 54/85  soft-tissue]
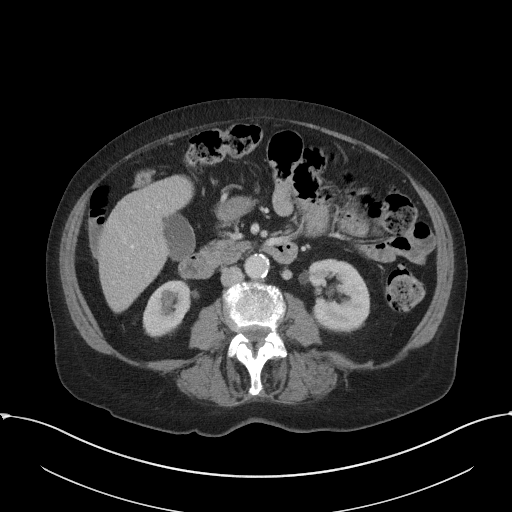
[im 54/85  bone]
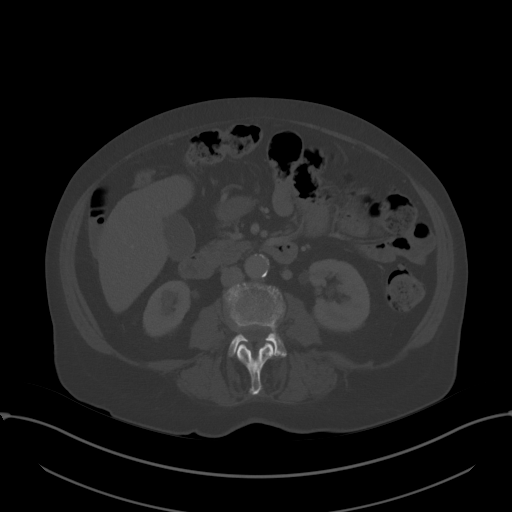
[im 62/85  soft-tissue]
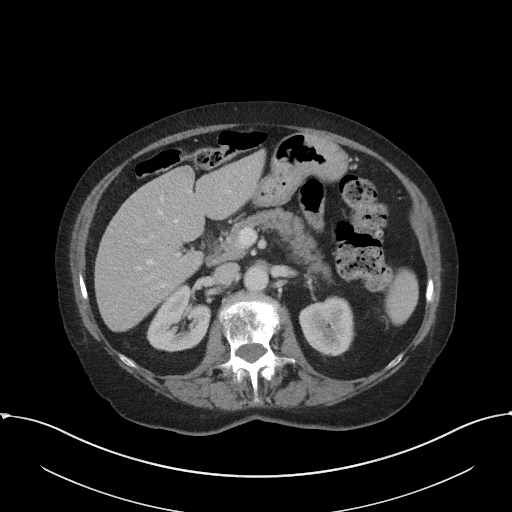
[im 67/85  soft-tissue]
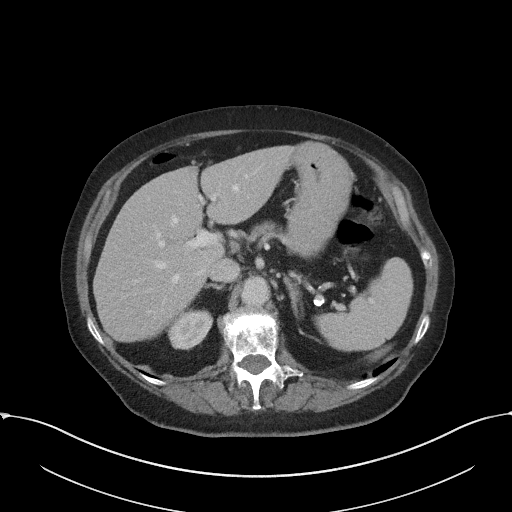
[im 71/85  soft-tissue]
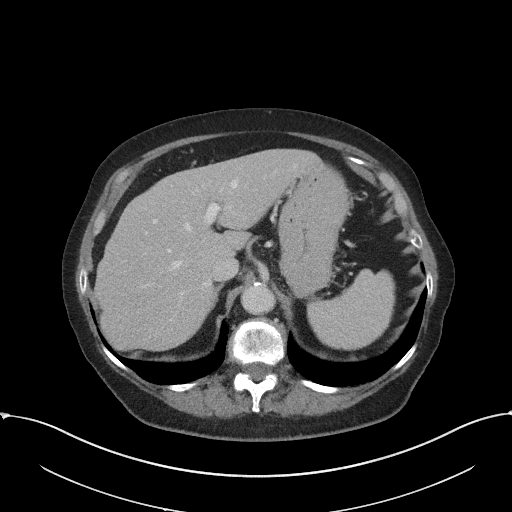
[im 80/85  soft-tissue]
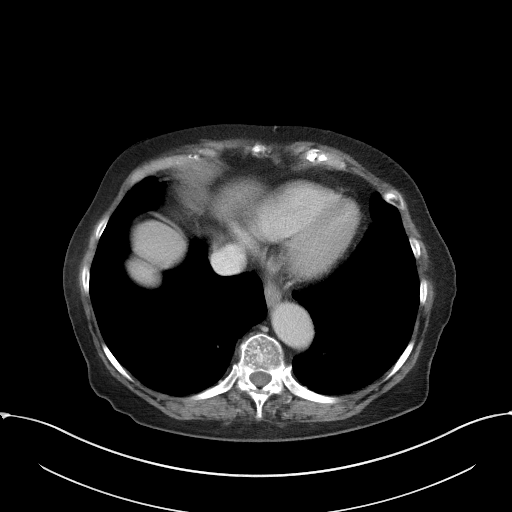

[Series 5: coronal st · coronal · 0.72mm/px · 3 of 94 slices shown]
[im 32/94  soft-tissue]
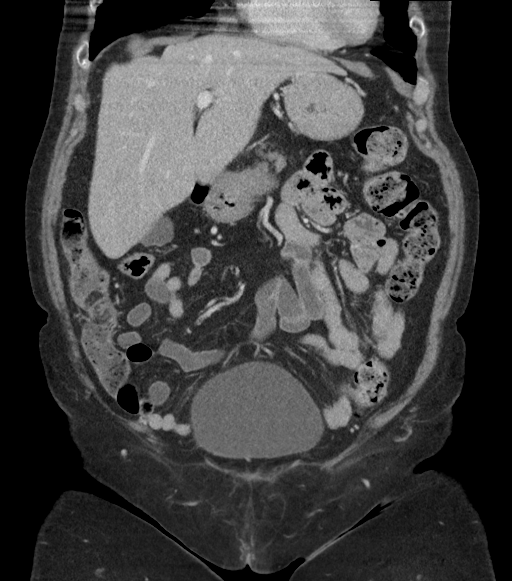
[im 42/94  soft-tissue]
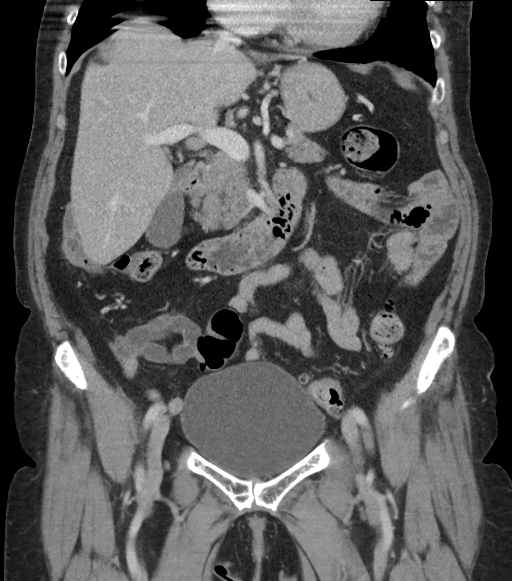
[im 52/94  soft-tissue]
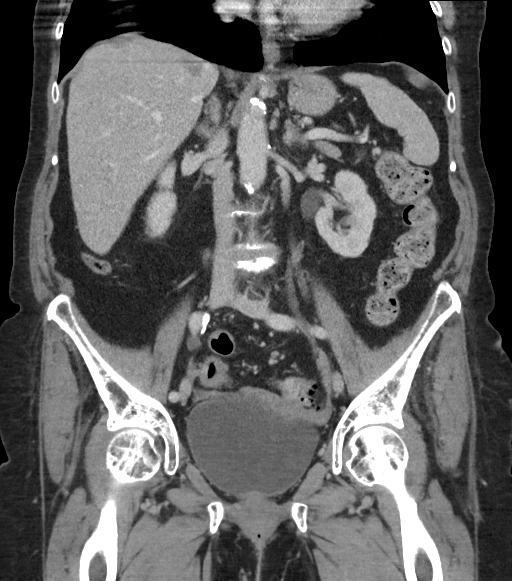

[16 of 46 positions shown; findings below may reference images not displayed]

FINDINGS: Lower chest: Mild scarring in the lung bases bilaterally. Aortic
atherosclerosis.

Hepatobiliary: Subcentimeter low-attenuation lesion in segment 4B of
the liver, too small to characterize, but favored to represent a
cyst. In the central aspect of segment 7 of the liver there is a
x 1.5 x 1.7 cm which is centrally low-attenuation with peripheral
nodular enhancement on portal venous phase imaging (incompletely
imaged on delayed imaging), incompletely characterized, but likely
to represent a cavernous hemangioma. No other suspicious hepatic
lesions. No intra or extrahepatic biliary ductal dilatation.

Pancreas: No pancreatic mass. No pancreatic ductal dilatation. No
pancreatic or peripancreatic fluid collections or inflammatory
changes.

Spleen: Normal appearance of the spleen.

Adrenals/Urinary Tract: Bilateral kidneys and bilateral adrenal
glands are normal in appearance. No hydroureteronephrosis. Urinary
bladder is normal in appearance.

Stomach/Bowel: Normal appearance of the stomach. No pathologic
dilatation of small bowel or colon. Numerous colonic diverticulae
are noted, particularly in the sigmoid colon, without definite
surrounding inflammatory changes to suggest an acute diverticulitis
at this time. Normal appendix.

Vascular/Lymphatic: Aortic atherosclerosis, without evidence of
aneurysm or dissection in the abdominal or pelvic vasculature. No
lymphadenopathy noted in the abdomen or pelvis.

Reproductive: Uterus and ovaries are atrophic.

Other: No significant volume of ascites.  No pneumoperitoneum.

Musculoskeletal: There are no aggressive appearing lytic or blastic
lesions noted in the visualized portions of the skeleton.
IMPRESSION: 1. No acute findings are noted in the abdomen or pelvis to account
for the patient's symptoms.
2. Severe colonic diverticulosis, without evidence of acute
diverticulitis at this time.
3. 2.6 x 1.5 x 1.7 cm lesion in segment 7 of the liver strongly
favored to represent a cavernous hemangioma.
4. Aortic atherosclerosis.
5. Additional incidental findings, as above.
